# Patient Record
Sex: Male | Born: 1970 | Race: Black or African American | Hispanic: No | Marital: Married | Smoking: Former smoker
Health system: Southern US, Community
[De-identification: ages and names within clinical notes are randomized; demographics above are authoritative.]

## PROBLEM LIST (undated history)

## (undated) DIAGNOSIS — R011 Cardiac murmur, unspecified: Secondary | ICD-10-CM

## (undated) HISTORY — PX: WRIST SURGERY: SHX841

---

## 2004-03-02 ENCOUNTER — Emergency Department: Payer: Self-pay | Admitting: Emergency Medicine

## 2004-11-13 ENCOUNTER — Inpatient Hospital Stay: Payer: Self-pay | Admitting: Internal Medicine

## 2007-07-16 ENCOUNTER — Ambulatory Visit: Payer: Self-pay | Admitting: Internal Medicine

## 2016-06-23 ENCOUNTER — Encounter: Payer: Self-pay | Admitting: Emergency Medicine

## 2016-06-23 ENCOUNTER — Emergency Department: Payer: BLUE CROSS/BLUE SHIELD

## 2016-06-23 ENCOUNTER — Emergency Department
Admission: EM | Admit: 2016-06-23 | Discharge: 2016-06-23 | Disposition: A | Discharge status: Home / Self Care | Payer: BLUE CROSS/BLUE SHIELD | Attending: Student in an Organized Health Care Education/Training Program | Admitting: Student in an Organized Health Care Education/Training Program

## 2016-06-23 DIAGNOSIS — Y929 Unspecified place or not applicable: Secondary | ICD-10-CM | POA: Insufficient documentation

## 2016-06-23 DIAGNOSIS — W1839XA Other fall on same level, initial encounter: Secondary | ICD-10-CM | POA: Insufficient documentation

## 2016-06-23 DIAGNOSIS — F17210 Nicotine dependence, cigarettes, uncomplicated: Secondary | ICD-10-CM | POA: Diagnosis not present

## 2016-06-23 DIAGNOSIS — S0181XA Laceration without foreign body of other part of head, initial encounter: Secondary | ICD-10-CM | POA: Diagnosis not present

## 2016-06-23 DIAGNOSIS — Y999 Unspecified external cause status: Secondary | ICD-10-CM | POA: Diagnosis not present

## 2016-06-23 DIAGNOSIS — S0990XA Unspecified injury of head, initial encounter: Secondary | ICD-10-CM | POA: Diagnosis present

## 2016-06-23 DIAGNOSIS — R55 Syncope and collapse: Secondary | ICD-10-CM | POA: Diagnosis not present

## 2016-06-23 DIAGNOSIS — Y939 Activity, unspecified: Secondary | ICD-10-CM | POA: Insufficient documentation

## 2016-06-23 HISTORY — DX: Cardiac murmur, unspecified: R01.1

## 2016-06-23 LAB — CBC
HEMATOCRIT: 42.5 % (ref 40.0–52.0)
HEMOGLOBIN: 14.3 g/dL (ref 13.0–18.0)
MCH: 35.3 pg — ABNORMAL HIGH (ref 26.0–34.0)
MCHC: 33.7 g/dL (ref 32.0–36.0)
MCV: 104.7 fL — AB (ref 80.0–100.0)
Platelets: 226 10*3/uL (ref 150–440)
RBC: 4.06 MIL/uL — ABNORMAL LOW (ref 4.40–5.90)
RDW: 12.5 % (ref 11.5–14.5)
WBC: 6.6 10*3/uL (ref 3.8–10.6)

## 2016-06-23 LAB — URINALYSIS, COMPLETE (UACMP) WITH MICROSCOPIC
BACTERIA UA: NONE SEEN
Bilirubin Urine: NEGATIVE
GLUCOSE, UA: NEGATIVE mg/dL
KETONES UR: NEGATIVE mg/dL
Leukocytes, UA: NEGATIVE
Nitrite: NEGATIVE
PROTEIN: NEGATIVE mg/dL
Specific Gravity, Urine: 1.014 (ref 1.005–1.030)
pH: 5 (ref 5.0–8.0)

## 2016-06-23 LAB — BASIC METABOLIC PANEL
ANION GAP: 8 (ref 5–15)
BUN: 12 mg/dL (ref 6–20)
CHLORIDE: 103 mmol/L (ref 101–111)
CO2: 28 mmol/L (ref 22–32)
Calcium: 9.5 mg/dL (ref 8.9–10.3)
Creatinine, Ser: 1.14 mg/dL (ref 0.61–1.24)
GFR calc Af Amer: >60 mL/min (ref >60)
GLUCOSE: 91 mg/dL (ref 65–99)
Potassium: 3.6 mmol/L (ref 3.5–5.1)
Sodium: 139 mmol/L (ref 135–145)

## 2016-06-23 LAB — TROPONIN I

## 2016-06-23 MED ORDER — LIDOCAINE HCL (PF) 1 % IJ SOLN
30.0000 mL | Freq: Once | INTRAMUSCULAR | Status: AC
  Administered 2016-06-23: 30 mL

## 2016-06-23 MED ORDER — LIDOCAINE HCL (PF) 1 % IJ SOLN: 10.0000 mL | Freq: Once | INTRAMUSCULAR | Status: DC

## 2016-06-23 MED ORDER — LORAZEPAM 2 MG PO TABS: 2.0000 mg | ORAL_TABLET | Freq: Once | ORAL | Status: DC

## 2016-06-23 MED ORDER — LIDOCAINE HCL (PF) 1 % IJ SOLN
INTRAMUSCULAR | Status: AC
  Administered 2016-06-23: 30 mL
  Filled 2016-06-23: qty 5

## 2016-06-23 MED ORDER — LIDOCAINE-EPINEPHRINE-TETRACAINE (LET) SOLUTION
NASAL | Status: AC
  Administered 2016-06-23: 3 mL via TOPICAL
  Filled 2016-06-23: qty 3

## 2016-06-23 MED ORDER — LIDOCAINE-EPINEPHRINE-TETRACAINE (LET) SOLUTION
3.0000 mL | Freq: Once | NASAL | Status: AC
  Administered 2016-06-23: 3 mL via TOPICAL

## 2016-06-23 MED ORDER — TRAMADOL HCL 50 MG PO TABS: 50.0000 mg | ORAL_TABLET | Freq: Four times a day (QID) | ORAL | 0 refills | Status: DC | PRN

## 2016-06-23 NOTE — ED Triage Notes (Signed)
Pt says he got up to go use the bathroom about 115am, was returning to bed when he felt dizzy and had a syncopal episode; says his wife woke him up on the floor; pt says he could hear his wife calling his name when he first fell but couldn't answer; pt alert and oriented x 3; talking in complete coherent sentences; laceration to chin; pt says he has had syncopal episodes over the last several years but has never seen an MD for same;

## 2016-06-23 NOTE — ED Notes (Signed)
Pt refusing chest xr, Dr. Roxan Hockey aware

## 2016-06-23 NOTE — ED Provider Notes (Signed)
[facility] Emergency Department Provider Note    None    (approximate)  I have reviewed the triage vital signs and the nursing notes.   HISTORY  Chief Complaint Loss of Consciousness    HPI Brandon Charles is a 46 y.o. male with a syncopal event falling and hitting his chin. Patient had several drinks tonight. Was walking in the room when wife heard. The patient was found on the floor with bleeding to his chin. Was out for roughly 1 minute. No shaking or seizure-like activity. Denies any chest pain or shortness of breath. Denies any numbness or tingling. Is not on any blood thinners. Patient did not want to come to the ER. And stated he just wanted to put Vaseline on the cot.   Past Medical History:  Diagnosis Date  . Murmur    History reviewed. No pertinent family history. Past Surgical History:  Procedure Laterality Date  . WRIST SURGERY Right    There are no active problems to display for this patient.     Prior to Admission medications   Medication Sig Start Date End Date Taking? Authorizing Provider  traMADol (ULTRAM) 50 MG tablet Take 1 tablet (50 mg total) by mouth every 6 (six) hours as needed. 06/23/16 06/23/17  Willy Eddy, MD    Allergies Patient has no known allergies.    Social History Social History  Substance Use Topics  . Smoking status: Current Every Day Smoker    Packs/day: 0.50    Types: Cigarettes  . Smokeless tobacco: Never Used  . Alcohol use Yes    Review of Systems Patient denies headaches, rhinorrhea, blurry vision, numbness, shortness of breath, chest pain, edema, cough, abdominal pain, nausea, vomiting, diarrhea, dysuria, fevers, rashes or hallucinations unless otherwise stated above in HPI. ____________________________________________   PHYSICAL EXAM:  VITAL SIGNS: Vitals:   06/23/16 0234 06/23/16 0445  BP: 125/87 125/65  Pulse: 76 73  Resp: 18 16  Temp: 98.7 F (37.1 C)     Constitutional:  Alert and oriented.  in no acute distress. Eyes: Conjunctivae are normal. PERRL. EOMI. Head: Atraumatic. Nose: No congestion/rhinnorhea. Mouth/Throat: Mucous membranes are moist.  Oropharynx non-erythematous. Neck: No stridor. Painless ROM. No cervical spine tenderness to palpation Hematological/Lymphatic/Immunilogical: No cervical lymphadenopathy. Cardiovascular: Normal rate, regular rhythm. Grossly normal heart sounds.  Good peripheral circulation. Respiratory: Normal respiratory effort.  No retractions. Lungs CTAB. Gastrointestinal: Soft and nontender. No distention. No abdominal bruits. No CVA tenderness. Musculoskeletal: No lower extremity tenderness nor edema.  No joint effusions. Neurologic:  Normal speech and language. No gross focal neurologic deficits are appreciated. No gait instability. Skin:  Skin is warm, dry and intact. 3cm laceration to inferior chin.  No active hemorrhage.   Psychiatric: Mood and affect are normal. Speech and behavior are normal.  ____________________________________________   LABS (all labs ordered are listed, but only abnormal results are displayed)  Results for orders placed or performed during the hospital encounter of 06/23/16 (from the past 24 hour(s))  Basic metabolic panel     Status: None   Collection Time: 06/23/16  2:43 AM  Result Value Ref Range   Sodium 139 135 - 145 mmol/L   Potassium 3.6 3.5 - 5.1 mmol/L   Chloride 103 101 - 111 mmol/L   CO2 28 22 - 32 mmol/L   Glucose, Bld 91 65 - 99 mg/dL   BUN 12 6 - 20 mg/dL   Creatinine, Ser 4.09 0.61 - 1.24 mg/dL   Calcium 9.5  8.9 - 10.3 mg/dL   GFR calc non Af Amer >60 >60 mL/min   GFR calc Af Amer >60 >60 mL/min   Anion gap 8 5 - 15  CBC     Status: Abnormal   Collection Time: 06/23/16  2:43 AM  Result Value Ref Range   WBC 6.6 3.8 - 10.6 K/uL   RBC 4.06 (L) 4.40 - 5.90 MIL/uL   Hemoglobin 14.3 13.0 - 18.0 g/dL   HCT 78.2 95.6 - 21.3 %   MCV 104.7 (H) 80.0 - 100.0 fL   MCH 35.3 (H)  26.0 - 34.0 pg   MCHC 33.7 32.0 - 36.0 g/dL   RDW 08.6 57.8 - 46.9 %   Platelets 226 150 - 440 K/uL  Urinalysis, Complete w Microscopic     Status: Abnormal   Collection Time: 06/23/16  2:43 AM  Result Value Ref Range   Color, Urine YELLOW (A) YELLOW   APPearance CLEAR (A) CLEAR   Specific Gravity, Urine 1.014 1.005 - 1.030   pH 5.0 5.0 - 8.0   Glucose, UA NEGATIVE NEGATIVE mg/dL   Hgb urine dipstick MODERATE (A) NEGATIVE   Bilirubin Urine NEGATIVE NEGATIVE   Ketones, ur NEGATIVE NEGATIVE mg/dL   Protein, ur NEGATIVE NEGATIVE mg/dL   Nitrite NEGATIVE NEGATIVE   Leukocytes, UA NEGATIVE NEGATIVE   RBC / HPF 6-30 0 - 5 RBC/hpf   WBC, UA 0-5 0 - 5 WBC/hpf   Bacteria, UA NONE SEEN NONE SEEN   Squamous Epithelial / LPF 0-5 (A) NONE SEEN   Mucous PRESENT   Troponin I     Status: None   Collection Time: 06/23/16  2:43 AM  Result Value Ref Range   Troponin I <0.03 <0.03 ng/mL   ____________________________________________  EKG My review and personal interpretation at Time: 2:39   Indication: syncope  Rate: 85  Rhythm: sinus Axis: normal Other: normal intervals, no WPW or brugada, no st elevations or depressions ____________________________________________  RADIOLOGY  I personally reviewed all radiographic images ordered to evaluate for the above acute complaints and reviewed radiology reports and findings.  These findings were personally discussed with the patient.  Please see medical record for radiology report.  ____________________________________________   PROCEDURES  Procedure(s) performed:  Marland KitchenMarland KitchenLaceration Repair Date/Time: 06/23/2016 6:34 AM Performed by: Willy Eddy Authorized by: Willy Eddy   Consent:    Consent obtained:  Verbal   Consent given by:  Patient Anesthesia (see MAR for exact dosages):    Anesthesia method:  Local infiltration   Local anesthetic:  Lidocaine 1% w/o epi Laceration details:    Location:  Face   Face location:  Chin   Length  (cm):  3   Depth (mm):  4 Repair type:    Repair type:  Simple Pre-procedure details:    Preparation:  Patient was prepped and draped in usual sterile fashion Exploration:    Wound exploration: wound explored through full range of motion     Contaminated: yes   Treatment:    Area cleansed with:  Saline   Amount of cleaning:  Standard   Irrigation solution:  Sterile saline Mucous membrane repair:    Suture size:  6-0   Suture material:  Vicryl   Number of sutures:  1 Skin repair:    Repair method:  Sutures   Suture size:  4-0   Suture material:  Nylon   Suture technique:  Running   Number of sutures:  7 Approximation:    Approximation:  Close  Vermilion border: well-aligned   Post-procedure details:    Dressing:  Open (no dressing)   Patient tolerance of procedure:  Tolerated well, no immediate complications      Critical Care performed: no ____________________________________________   INITIAL IMPRESSION / ASSESSMENT AND PLAN / ED COURSE  Pertinent labs & imaging results that were available during my care of the patient were reviewed by me and considered in my medical decision making (see chart for details).  DDX: lac, contusion, concussion, ich   Brandon Charles is a 46 y.o. who presents to the ED with ffs and syncope as described above with 3 cm laceration on chin. No distal loss of sensation of motor deficit. Denies any other injuries. Td UTD. VSS. Exam with distal NV intact. No functional tendon deficit, no sensory deficit. No signs of erythema or surrounding infection. Blood work is otherwise reassuring. CT imaging of the head ordered to evaluate for acute traumatic injury.  CT imaging unremarkable. No chest pain or shortness of breath prior to syncopal event and patient with a normal EKG and troponin.  He did admit to drinking alcohol which could've put him at risk for increased dehydration.   Plan lac repair.  Laceration was explored, irrigated, and repaired  without complications. No FB in a bloodless field.    Discussed at length wound care and infection precautions with patient.       ____________________________________________   FINAL CLINICAL IMPRESSION(S) / ED DIAGNOSES  Final diagnoses:  Laceration of chin without complication, initial encounter  Syncope and collapse      NEW MEDICATIONS STARTED DURING THIS VISIT:  New Prescriptions   TRAMADOL (ULTRAM) 50 MG TABLET    Take 1 tablet (50 mg total) by mouth every 6 (six) hours as needed.     Note:  This document was prepared using Dragon voice recognition software and may include unintentional dictation errors. `   Willy Eddy, MD 06/23/16 [phone]

## 2016-06-23 NOTE — ED Notes (Signed)
Dr. Roxan Hockey at bedside for lac repair

## 2016-08-26 ENCOUNTER — Encounter (INDEPENDENT_AMBULATORY_CARE_PROVIDER_SITE_OTHER): Payer: Self-pay

## 2016-08-26 ENCOUNTER — Ambulatory Visit (INDEPENDENT_AMBULATORY_CARE_PROVIDER_SITE_OTHER): Payer: BLUE CROSS/BLUE SHIELD | Admitting: Family Medicine

## 2016-08-26 ENCOUNTER — Encounter: Payer: Self-pay | Admitting: Family Medicine

## 2016-08-26 VITALS — BP 100/68 | HR 94 | Temp 98.8°F | Ht 72.0 in | Wt 144.6 lb

## 2016-08-26 DIAGNOSIS — R55 Syncope and collapse: Secondary | ICD-10-CM | POA: Insufficient documentation

## 2016-08-26 DIAGNOSIS — R079 Chest pain, unspecified: Secondary | ICD-10-CM | POA: Diagnosis not present

## 2016-08-26 NOTE — Patient Instructions (Signed)
Nice to see you. We will get you set up with a cardiologist and we will send them a message regarding your symptoms. Please try to stay well hydrated as I think dehydration is contributing to your dizziness. If you develop chest pain, shortness breath, palpitations, passing out again, or any new or changing symptoms please seek medical attention medially.

## 2016-08-26 NOTE — Progress Notes (Signed)
Marikay AlarEric Joss Friedel, MD Phone: [phone]  Barbara CowerGill N Aycock is a 46 y.o. male who presents today for new patient visit.  Patient reports intermittent lightheadedness. Particularly if he stands up too quickly. He passed out on one occasion 2 months ago. He was evaluated in the emergency room for this. He describes his lightheadedness is feeling weak overall. Feels as though it would improve if he ate something. Typically only occurs if he stands up too quickly. When he passed out he had no incontinence episodes. No chest pain or palpitations prior to. No shortness of breath. He notes he had walked down to the bathroom and turned around after using the bathroom and his wife noted him to be on the floor. He felt a little lightheaded after the episode. He came to easily. Workup in the ED was unremarkable for cause though did note he had been drinking alcohol. He does note occasional lower rib and lower chest discomfort around the xiphoid process that occurs infrequently. Typically occurs with taking a deep breath. Does not happen frequently. It is not exertional. There is no shortness of breath with it. There is no radiation or diaphoresis. No cough. 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 He notes no diabetes or hypertension or cholesterol issues. He reports he had lab work done a month ago through work.  Active Ambulatory Problems    Diagnosis Date Noted  . Chest pain 08/26/2016  . Syncope 08/26/2016   Resolved Ambulatory Problems    Diagnosis Date Noted  . No Resolved Ambulatory Problems   Past Medical History:  Diagnosis Date  . Murmur     Family History  Problem Relation Age of Onset  . Breast cancer Mother     Social History   Social History  . Marital status: Married    Spouse name: N/A  . Number of children: N/A  . Years of education: N/A   Occupational History  . Not on file.   Social History Main Topics  . Smoking status: Current Every Day  Smoker    Packs/day: 0.50    Types: Cigarettes  . Smokeless tobacco: Never Used  . Alcohol use Yes  . Drug use: No  . Sexual activity: Not on file   Other Topics Concern  . Not on file   Social History Narrative  . No narrative on file    ROS  General:  Negative for nexplained weight loss, fever Skin: Negative for new or changing mole, sore that won't heal HEENT: Negative for trouble hearing, trouble seeing, ringing in ears, mouth sores, hoarseness, change in voice, dysphagia. CV:  Positive for chest pain, negative dyspnea, edema, palpitations Resp: Negative for cough, dyspnea, hemoptysis GI: Negative for nausea, vomiting, diarrhea, constipation, abdominal pain, melena, hematochezia. GU: Negative for dysuria, incontinence, urinary hesitance, hematuria, vaginal or penile discharge, polyuria, sexual difficulty, lumps in testicle or breasts MSK: Negative for muscle cramps or aches, joint pain or swelling Neuro: Negative for headaches, weakness, numbness, dizziness, passing out/fainting Psych: Negative for depression, anxiety, memory problems  Objective  Physical Exam Vitals:   08/26/16 1421  BP: 100/68  Pulse: 94  Temp: 98.8 F (37.1 C)   Laying blood pressure 128/86 pulse 89 Sitting blood pressure 106/76 pulse 86 Standing blood pressure 100/64 pulse 91  BP Readings from Last 3 Encounters:  08/26/16 100/68  06/23/16 118/68   Wt Readings from Last 3 Encounters:  08/26/16 144 lb 9.6 oz (65.6 kg)  06/23/16 125 lb (  56.7 kg)    Physical Exam  Constitutional: No distress.  HENT:  Head: Normocephalic and atraumatic.  Mouth/Throat: Oropharynx is clear and moist. No oropharyngeal exudate.  Eyes: Conjunctivae are normal. Pupils are equal, round, and reactive to light.  Cardiovascular: Normal rate, regular rhythm and normal heart sounds.   Pulmonary/Chest: Effort normal and breath sounds normal.  Abdominal: Soft. Bowel sounds are normal. He exhibits no distension. There  is no tenderness. There is no rebound and no guarding.  Musculoskeletal: He exhibits no edema.  Neurological: He is alert. Gait normal.  Skin: Skin is warm and dry. He is not diaphoretic.  Psychiatric: Mood and affect normal.   EKG: Normal sinus rhythm, negative precordial T waves, J-point elevation, early repolarization changes  Assessment/Plan:   Chest pain Patient with atypical chest pain described as a cramp. Nonexertional. No shortness of breath or diaphoresis. No risk factor for cardiac disease. Suspect musculoskeletal discomfort given description. EKG performed with no apparent ischemic changes. He'll monitor. Cardiology referral. Given return precautions.  Syncope Patient was single episode of syncope several months ago and was evaluated in the ED. Workup with negative troponins and unremarkable CT scan. No seizure-like activity with it. He is orthostatic today on exam which could be contributing. His EKG at that time revealed LVH and repeat EKG today revealed negative precordial T waves and early repolarization changes. Given syncope and possible LVH we'll have him evaluated by cardiology. Referral will be made. We will send a message to one of our cardiology colleagues to get their input as well. Patient will bring in his recent lab work to determine if he needs any repeat lab work. Patient is given return precautions.   Orders Placed This Encounter  Procedures  . Ambulatory referral to Cardiology    Referral Priority:   Routine    Referral Type:   Consultation    Referral Reason:   Specialty Services Required    Requested Specialty:   Cardiology    Number of Visits Requested:   1  . EKG 12-Lead    No orders of the defined types were placed in this encounter.    Marikay Alar, MD [facility]

## 2016-08-26 NOTE — Assessment & Plan Note (Addendum)
Patient with atypical chest pain described as a cramp. Nonexertional. No shortness of breath or diaphoresis. No risk factor for cardiac disease. Suspect musculoskeletal discomfort given description. EKG performed with no apparent ischemic changes. He'll monitor. Cardiology referral. Given return precautions.

## 2016-08-26 NOTE — Assessment & Plan Note (Addendum)
Patient was single episode of syncope several months ago and was evaluated in the ED. Workup with negative troponins and unremarkable CT scan. No seizure-like activity with it. He is orthostatic today on exam which could be contributing. His EKG at that time revealed LVH and repeat EKG today revealed negative precordial T waves and early repolarization changes. Given syncope and possible LVH we'll have him evaluated by cardiology. Referral will be made. We will send a message to one of our cardiology colleagues to get their input as well. Patient will bring in his recent lab work to determine if he needs any repeat lab work. Patient is given return precautions.

## 2016-08-28 ENCOUNTER — Telehealth: Payer: Self-pay

## 2016-08-28 NOTE — Telephone Encounter (Signed)
New patient referral for syncope please read referral details and note from sonnenberg and advise if it is ok to schedule with EP Dr. Graciela HusbandsKlein or if patient should see Cardio for next available appt in August

## 2016-08-28 NOTE — Telephone Encounter (Signed)
From looking at Dr Purvis SheffieldSonnenberg's note, he references cardiology referral.  Patient appropriate for referral to cardiologist at this time. His note did not reference EP at this time.

## 2016-08-29 ENCOUNTER — Encounter: Payer: Self-pay | Admitting: Cardiovascular Disease

## 2016-08-29 ENCOUNTER — Ambulatory Visit (INDEPENDENT_AMBULATORY_CARE_PROVIDER_SITE_OTHER): Payer: BLUE CROSS/BLUE SHIELD | Admitting: Cardiovascular Disease

## 2016-08-29 VITALS — BP 100/58 | HR 85 | Ht 72.0 in | Wt 136.0 lb

## 2016-08-29 DIAGNOSIS — R079 Chest pain, unspecified: Secondary | ICD-10-CM | POA: Diagnosis not present

## 2016-08-29 DIAGNOSIS — I951 Orthostatic hypotension: Secondary | ICD-10-CM | POA: Diagnosis not present

## 2016-08-29 DIAGNOSIS — R55 Syncope and collapse: Secondary | ICD-10-CM

## 2016-08-29 MED ORDER — FLUDROCORTISONE ACETATE 0.1 MG PO TABS: 0.1000 mg | ORAL_TABLET | Freq: Every day | ORAL | 11 refills | Status: DC

## 2016-08-29 MED ORDER — MIDODRINE HCL 10 MG PO TABS: 10.0000 mg | ORAL_TABLET | Freq: Three times a day (TID) | ORAL | 6 refills | Status: DC | PRN

## 2016-08-29 NOTE — Patient Instructions (Addendum)
Medication Instructions:   Please start florinef once a day  Please take midodrine up to three times a day (1/2 pill or whole pill) as needed  Labwork:  BMP in one month  Testing/Procedures:  No further testing at this time   I recommend watching educational videos on topics of interest to you at:       [email or web address]  Enter code: [email or web address]    Follow-Up: It was a pleasure seeing you in the office today. Please call us if you have new issues that need to be addressed before your next appt.  [phone]  Your physician wants you to follow-up in: 3 months.  You will receive a reminder letter in the mail two months in advance. If you don't receive a letter, please call our office to schedule the follow-up appointment.  If you need a refill on your cardiac medications before your next appointment, please call your pharmacy.

## 2016-08-29 NOTE — Progress Notes (Signed)
Cardiology Office Note  Date:  08/29/2016   ID:  Brandon Charles, DOB [birth year], MRN [id number]  PCP:  Glori LuisSonnenberg, Eric G, MD   Chief Complaint  Patient presents with  . other    New patient. Patient c/o feeling dizzy at times. Patient denies chest pain and SOB.     HPI:  Brandon Charles is a 46 y.o. male  With long history of near-syncope and syncope dating back 20 years previous workup including stress testing and echocardiography unrevealing Chronic orthostasis Recent episode of syncope 2 months ago while getting up to go to the bathroom  Who presents by referral from Dr. De NurseSonneberg for consultation of his orthostasis and syncope  He denies any significant chest pain or shortness of breath, active at baseline Works in [employer] Reports having long history of orthostasis, typically worse when standing for long period time or getting up from a sitting or supine position  He denies any palpitations concerning for arrhythmia Typically recovers relatively quickly though sometimes takes him a day or 2 to recover after near syncope/syncope  He has been to the emergency room in the past for his symptoms Often has to sit on the toilet until dizziness improves and then crawl back to bed  He notes having diabetes or cholesterol issues.   EKG personally reviewed by myself on todays visit Shows normal sinus rhythm with rate 85 bpm no significant ST or T-wave changes   PMH:   has a past medical history of Murmur.  PSH:    Past Surgical History:  Procedure Laterality Date  . WRIST SURGERY Right     Current Outpatient Prescriptions  Currently on no prescription medications  No current facility-administered medications for this visit.      Allergies:   Patient has no known allergies.   Social History:  The patient  reports that he has been smoking Cigarettes.  He has been smoking about 0.50 packs per day. He has never used smokeless tobacco. He reports that he drinks  alcohol. He reports that he does not use drugs.   Family History:   family history includes Breast cancer in his mother.    Review of Systems: Review of Systems  Constitutional: Negative.   Respiratory: Negative.   Cardiovascular: Negative.   Gastrointestinal: Negative.   Musculoskeletal: Negative.   Neurological: Positive for dizziness and loss of consciousness.  Psychiatric/Behavioral: Negative.   All other systems reviewed and are negative.    PHYSICAL EXAM: VS:  BP (!) 100/58 (BP Location: Right Arm, Patient Position: Sitting, Cuff Size: Normal)   Pulse 85   Ht 6' (1.829 m)   Wt 136 lb (61.7 kg)   BMI 18.44 kg/m  , BMI Body mass index is 18.44 kg/m. GEN: Very thin, in no acute distress  HEENT: normal  Neck: no JVD, carotid bruits, or masses Cardiac: RRR; no murmurs, rubs, or gallops,no edema  Respiratory:  clear to auscultation bilaterally, normal work of breathing GI: soft, nontender, nondistended, + BS MS: no deformity or atrophy  Skin: warm and dry, no rash Neuro:  Strength and sensation are intact Psych: euthymic mood, full affect    Recent Labs: 06/23/2016: BUN 12; Creatinine, Ser 1.14; Hemoglobin 14.3; Platelets 226; Potassium 3.6; Sodium 139    Lipid Panel No results found for: CHOL, HDL, LDLCALC, TRIG    Wt Readings from Last 3 Encounters:  08/29/16 136 lb (61.7 kg)  08/26/16 144 lb 9.6 oz (65.6 kg)  06/23/16 125 lb (56.7 kg)  ASSESSMENT AND PLAN:  Syncope, unspecified syncope type - Plan: EKG 12-Lead, Basic Metabolic Panel (BMET) Near-syncope and syncope episodes likely secondary to orthostasis Management as below  Chest pain, unspecified type - Plan: EKG 12-Lead, Basic Metabolic Panel (BMET) He denies any significant chest pain on exertion No plan for ischemia workup at this time  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 BMP in one month Also recommended he have a prescription for midodrine that he take as needed for now He may need 5 mg 3 times a day titrating up to 10 mg 3 times a day if needed for symptom control  Disposition:   F/U  3 months  Patient was seen in consultation for Dr. De Nurse and will be referred back to his clinic for ongoing care of the issues detailed above   Total encounter time more than 60 minutes  Greater than 50% was spent in counseling and coordination of care with the patient    Orders Placed This Encounter  Procedures  . Basic Metabolic Panel (BMET)  . EKG 12-Lead     Signed, Dossie Arbour, M.D., Ph.D. 08/29/2016  [facility] [place], [place] [phone]

## 2016-08-29 NOTE — Telephone Encounter (Signed)
Attempted to schedule from waitlist for a new patient appt today with Dr. Mariah MillingGollan at 11 am.  lmov to call office .

## 2016-11-27 ENCOUNTER — Ambulatory Visit: Payer: BLUE CROSS/BLUE SHIELD | Admitting: Family Medicine

## 2016-11-27 DIAGNOSIS — Z0289 Encounter for other administrative examinations: Secondary | ICD-10-CM

## 2016-12-03 NOTE — Progress Notes (Signed)
Cardiology Office Note  Date:  12/04/2016   ID:  Brandon Charles, DOB Mar 24, 1970, MRN [id number]  PCP:  Glori Luis, MD   Chief Complaint  Patient presents with  . Other    3 month follow up. Patient denies chest pain and SOB. Meds reviewed verbally with patient.     HPI:  Brandon Charles is a 46 y.o. male  With long history of near-syncope and syncope dating back 20 years previous workup including stress testing and echocardiography unrevealing Chronic orthostasis episode of syncope over the summer 2018  while getting up to go to the bathroom  Who presents or follow-up of his orthostasis and syncope Low weight, started on Florinef 0.1 mg daily with midodrine as needed  In follow-up today reports he feels much better,no near syncope or syncope Rare episode of lightheadedness Did not like side effects of midodrine, felt "funny". Thinks he took 5 mg once Tolerating Florinef well, no leg swelling   denies any significant chest pain or shortness of breath, active at baseline Works in [employer]  He declined EKG on today's visit  Other past medical history reviewed long history of orthostasis, typically worse when standing for long period time or getting up from a sitting or supine position  He denies any palpitations concerning for arrhythmia Typically recovers relatively quickly though sometimes takes him a day or 2 to recover after near syncope/syncope  He has been to the emergency room in the past for his symptoms Often has to sit on the toilet until dizziness improves and then crawl back to bed   PMH:   has a past medical history of Murmur.  PSH:    Past Surgical History:  Procedure Laterality Date  . WRIST SURGERY Right    Current Outpatient Prescriptions on File Prior to Visit  Medication Sig Dispense Refill  . fludrocortisone (FLORINEF) 0.1 MG tablet Take 1 tablet (0.1 mg total) by mouth daily. 30 tablet 11  . midodrine (PROAMATINE) 10 MG tablet Take  1 tablet (10 mg total) by mouth 3 (three) times daily as needed. 90 tablet 6   No current facility-administered medications on file prior to visit.     Allergies:   Patient has no known allergies.   Social History:  The patient  reports that he has been smoking Cigarettes.  He has been smoking about 0.50 packs per day. He has never used smokeless tobacco. He reports that he drinks alcohol. He reports that he does not use drugs.   Family History:   family history includes Breast cancer in his mother.    Review of Systems:- Review of Systems  Constitutional: Negative.   Respiratory: Negative.   Cardiovascular: Negative.   Gastrointestinal: Negative.   Musculoskeletal: Negative.   Neurological:       Rare episodes of dizziness if he stands up too quickly  Psychiatric/Behavioral: Negative.   All other systems reviewed and are negative.    PHYSICAL EXAM: VS:  BP 108/62 (BP Location: Left Arm, Patient Position: Sitting, Cuff Size: Normal)   Pulse 94   Ht 6' (1.829 m)   Wt 138 lb 8 oz (62.8 kg)   BMI 18.78 kg/m  , BMI Body mass index is 18.78 kg/m.  GEN: Very thin, in no acute distress  HEENT: normal  Neck: no JVD, carotid bruits, or masses Cardiac: RRR; no murmurs, rubs, or gallops,no edema  Respiratory:  clear to auscultation bilaterally, normal work of breathing GI: soft, nontender, nondistended, + BS MS:  no deformity or atrophy  Skin: warm and dry, no rash Neuro:  Strength and sensation are intact Psych: euthymic mood, full affect    Recent Labs: 06/23/2016: BUN 12; Creatinine, Ser 1.14; Hemoglobin 14.3; Platelets 226; Potassium 3.6; Sodium 139    Lipid Panel No results found for: CHOL, HDL, LDLCALC, TRIG    Wt Readings from Last 3 Encounters:  12/04/16 138 lb 8 oz (62.8 kg)  08/29/16 136 lb (61.7 kg)  08/26/16 144 lb 9.6 oz (65.6 kg)       ASSESSMENT AND PLAN:  Syncope, unspecified syncope type - Plan: EKG 12-Lead, Basic Metabolic Panel  (BMET) Near-syncope and syncope episodes likely secondary to orthostasis Started on Florinef 0.1 mg daily on his last clinic visit with improvement of his symptoms We will check basic metabolic panel today Continue current dosing Midodrine only as needed for severe orthostasis verall stable  Chest pain, unspecified type - Plan: EKG 12-Lead, Basic Metabolic Panel (BMET) He denies any significant chest pain on exertion No further workup at this time, stable  Orthostatic hypotension As above will continue Florinef daily, midodrine only as needed BMP ordered as above  Disposition:   F/U  12 months   Total encounter time more than 25 minutes  Greater than 50% was spent in counseling and coordination of care with the patient      Orders Placed This Encounter  Procedures  . Basic Metabolic Panel (BMET)     Signed, Dossie Arbour, M.D., Ph.D. 12/04/2016  [facility] [place], [place] [phone]

## 2016-12-04 ENCOUNTER — Encounter: Payer: Self-pay | Admitting: Cardiovascular Disease

## 2016-12-04 ENCOUNTER — Ambulatory Visit (INDEPENDENT_AMBULATORY_CARE_PROVIDER_SITE_OTHER): Payer: BLUE CROSS/BLUE SHIELD | Admitting: Cardiovascular Disease

## 2016-12-04 VITALS — BP 108/62 | HR 94 | Ht 72.0 in | Wt 138.5 lb

## 2016-12-04 DIAGNOSIS — I951 Orthostatic hypotension: Secondary | ICD-10-CM

## 2016-12-04 DIAGNOSIS — R55 Syncope and collapse: Secondary | ICD-10-CM

## 2016-12-04 MED ORDER — FLUDROCORTISONE ACETATE 0.1 MG PO TABS: 0.1000 mg | ORAL_TABLET | Freq: Every day | ORAL | 4 refills | Status: DC

## 2016-12-04 NOTE — Patient Instructions (Addendum)
Medication Instructions:   No medication changes made  Labwork:  BMP today for syncope  Testing/Procedures:  No further testing at this time   Follow-Up: It was a pleasure seeing you in the office today. Please call us if you have new issues that need to be addressed before your next appt.  [phone]  Your physician wants you to follow-up in: 12 months.  You will receive a reminder letter in the mail two months in advance. If you don't receive a letter, please call our office to schedule the follow-up appointment.  If you need a refill on your cardiac medications before your next appointment, please call your pharmacy.

## 2016-12-06 LAB — BASIC METABOLIC PANEL
BUN/Creatinine Ratio: 11 (ref 9–20)
BUN: 13 mg/dL (ref 6–24)
CO2: 22 mmol/L (ref 20–29)
CREATININE: 1.2 mg/dL (ref 0.76–1.27)
Calcium: 10.1 mg/dL (ref 8.7–10.2)
Chloride: 98 mmol/L (ref 96–106)
GFR calc Af Amer: 85 mL/min/{1.73_m2} (ref >59)
GFR calc non Af Amer: 74 mL/min/{1.73_m2} (ref >59)
Glucose: 81 mg/dL (ref 65–99)
Potassium: 4.6 mmol/L (ref 3.5–5.2)
SODIUM: 141 mmol/L (ref 134–144)

## 2016-12-09 ENCOUNTER — Telehealth: Payer: Self-pay | Admitting: Cardiovascular Disease

## 2016-12-09 NOTE — Telephone Encounter (Signed)
Pt returning our call for lab results ° °Please call back ° °

## 2016-12-09 NOTE — Telephone Encounter (Signed)
See results note. 

## 2018-05-26 IMAGING — CT CT HEAD W/O CM
3 series · 15 of 45 positions shown, 18 images · non-contrast
Comparison: None.

CLINICAL DATA: Syncopal episode after becoming lightheaded while
arising to use the bathroom at [DATE].

EXAM:
CT HEAD WITHOUT CONTRAST
TECHNIQUE: Contiguous axial images were obtained from the base of the skull
through the vertex without intravenous contrast.

[Series 2: head wo · axial · 0.40mm/px · z∈[-103,+12]mm · 9 of 28 slices shown, 12 images]
[im 3/28  brain]
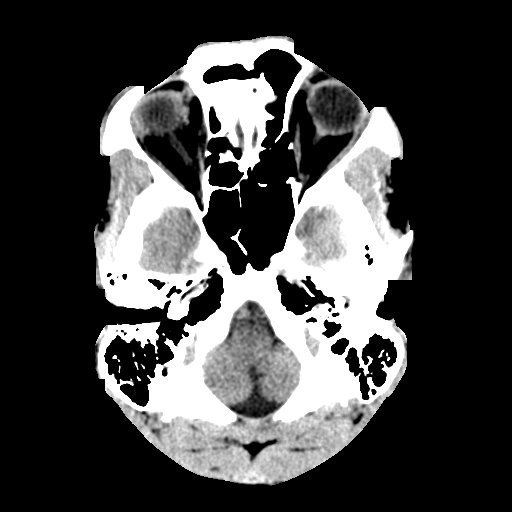
[im 3/28  bone]
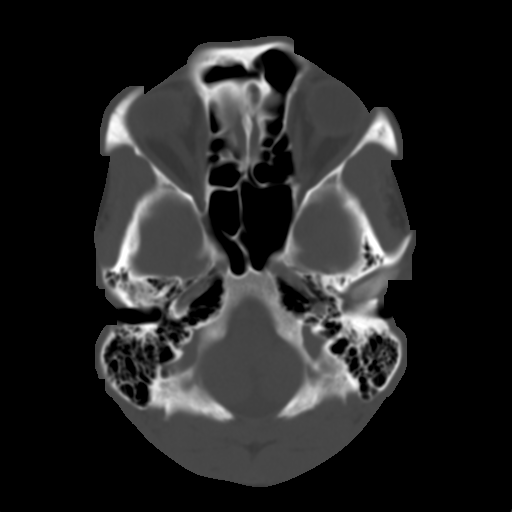
[im 6/28  brain]
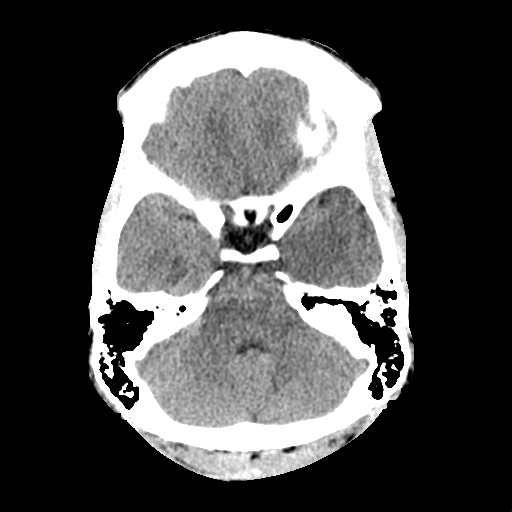
[im 9/28  brain]
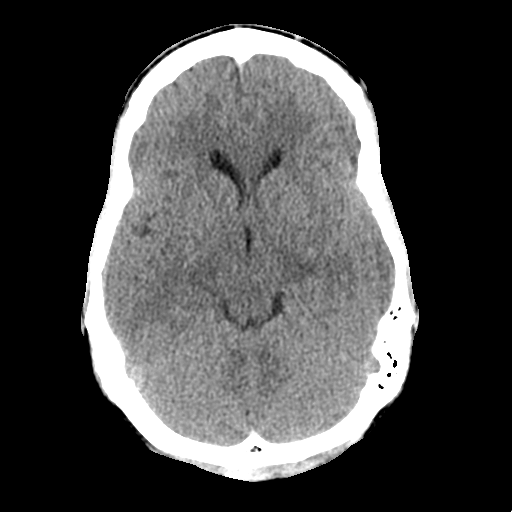
[im 12/28  brain]
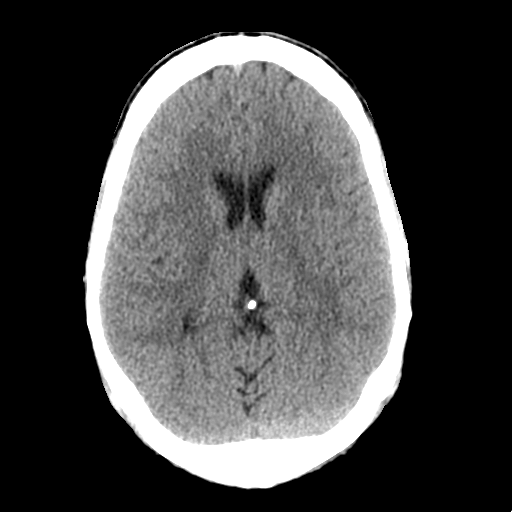
[im 15/28  brain]
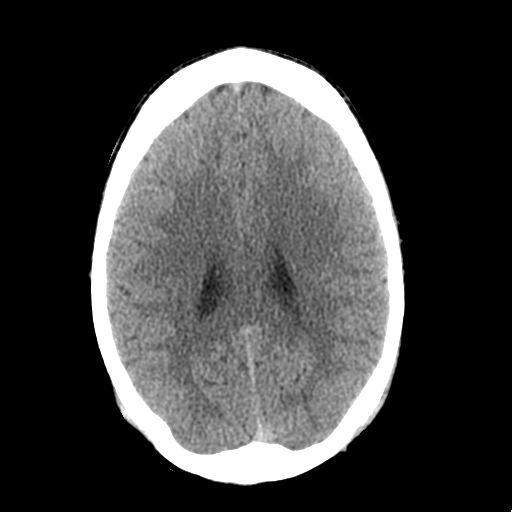
[im 15/28  bone]
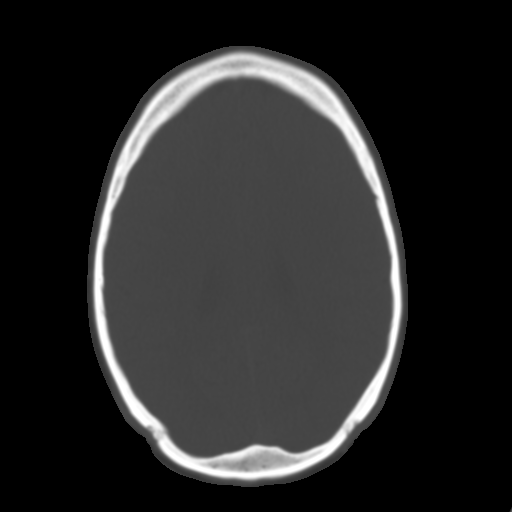
[im 17/28  brain]
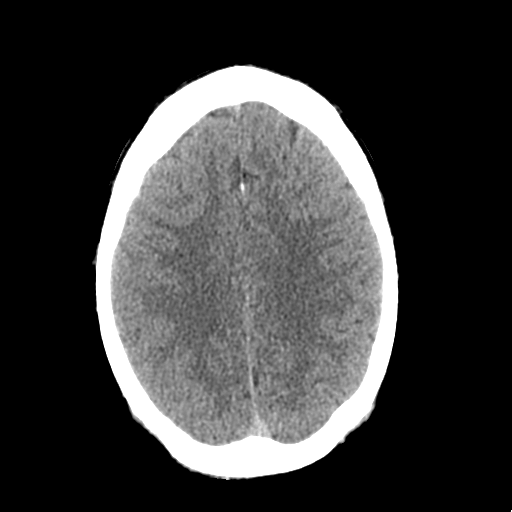
[im 20/28  brain]
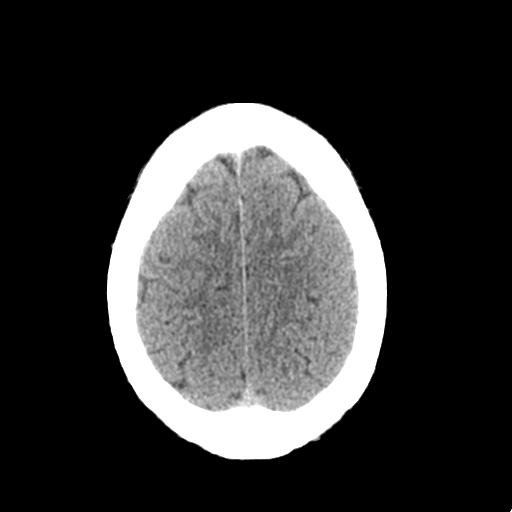
[im 23/28  brain]
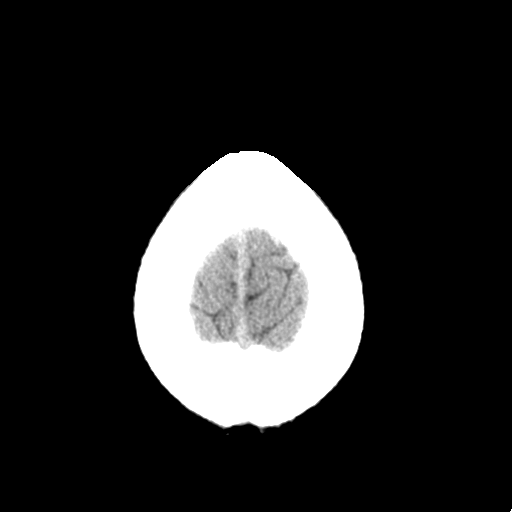
[im 26/28  brain]
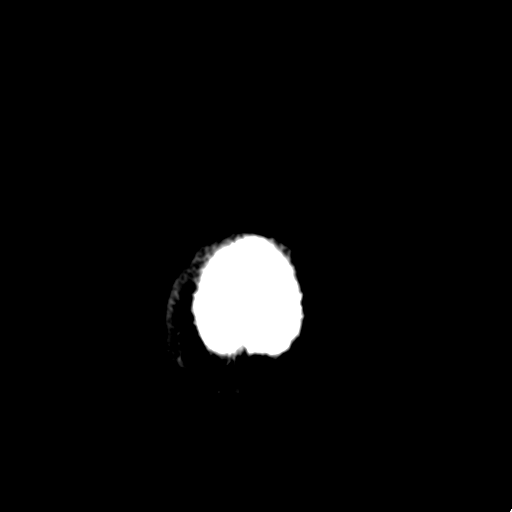
[im 26/28  bone]
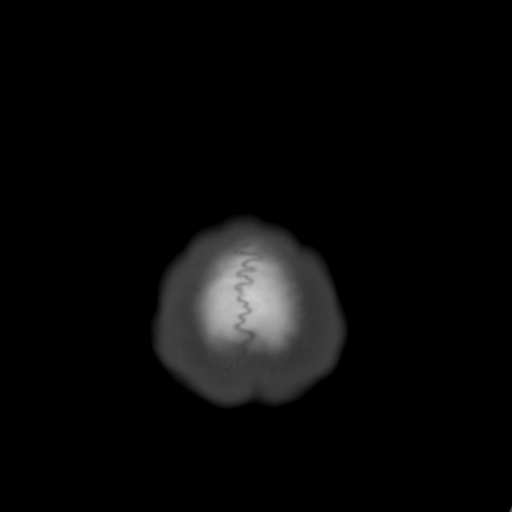

[Series 4: coronal soft tissue · coronal · 0.27mm/px · 3 of 65 slices shown]
[im 22/65  brain]
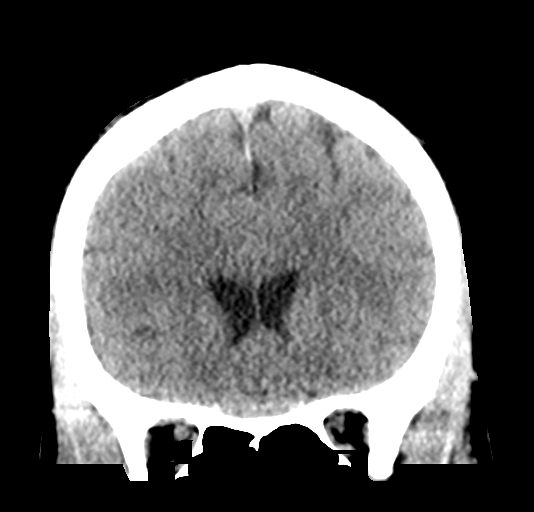
[im 29/65  brain]
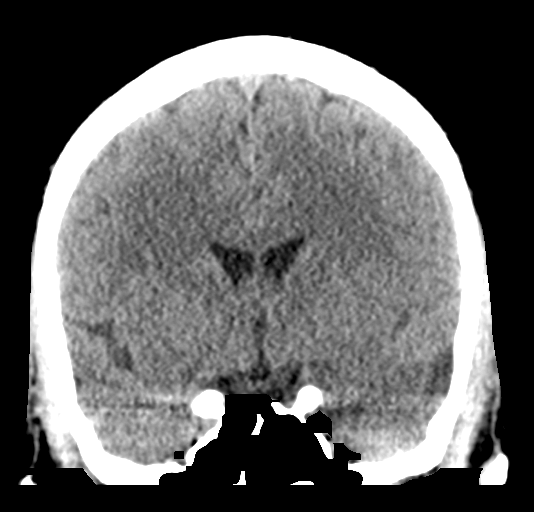
[im 36/65  brain]
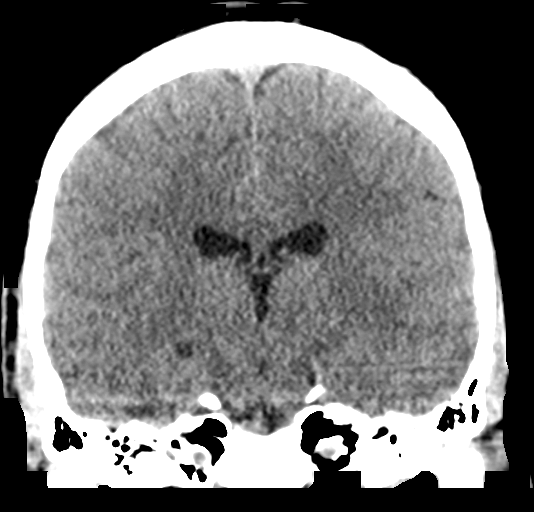

[Series 5: sagittal soft tissue · sagittal · 0.27mm/px · 3 of 49 slices shown]
[im 17/49  brain]
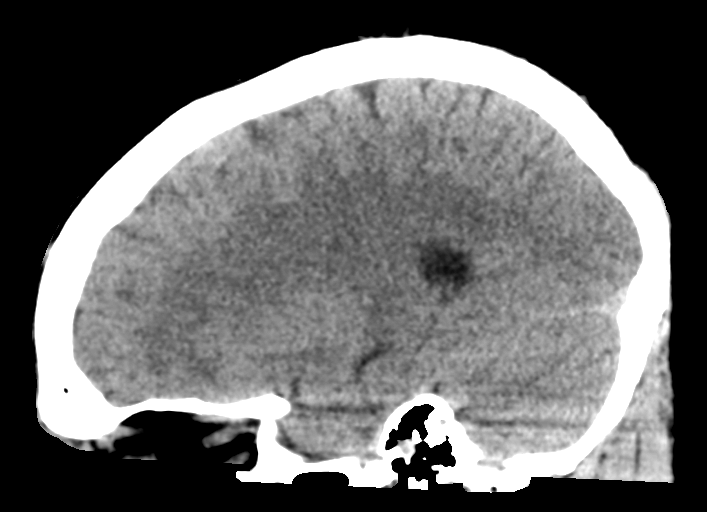
[im 25/49  brain]
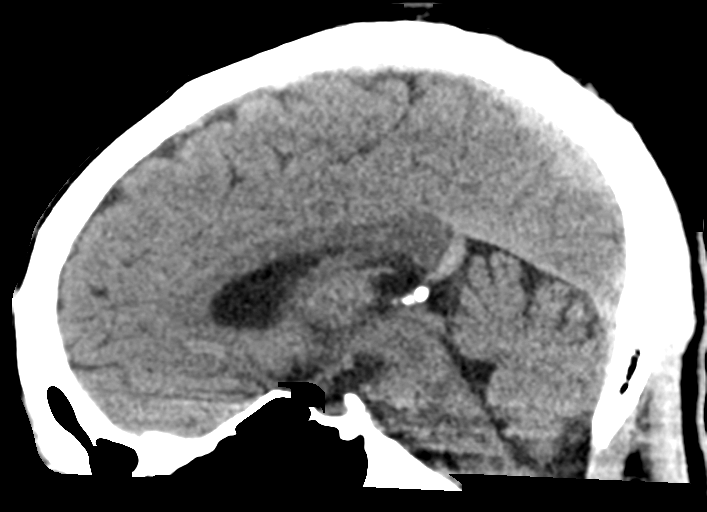
[im 33/49  brain]
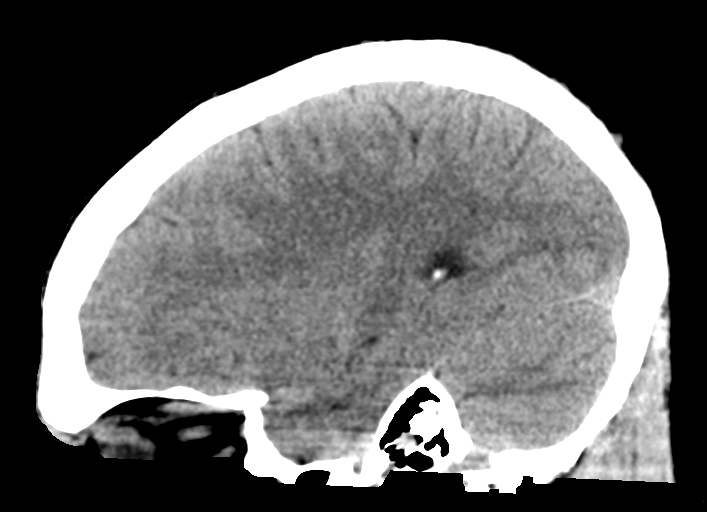

[15 of 45 positions shown; findings below may reference images not displayed]

FINDINGS: Brain: There is no intracranial hemorrhage, mass or evidence of
acute infarction. There is no extra-axial fluid collection. Gray
matter and white matter appear normal. Cerebral volume is normal for
age. Brainstem and posterior fossa are unremarkable. The CSF spaces
appear normal.

Vascular: No hyperdense vessel or unexpected calcification.

Skull: Normal. Negative for fracture or focal lesion.

Sinuses/Orbits: No acute finding.

Other: None.
IMPRESSION: Normal brain

## 2018-06-23 ENCOUNTER — Telehealth: Payer: Self-pay

## 2018-06-23 NOTE — Telephone Encounter (Signed)
Virtual Visit Pre-Appointment Phone Call  Steps For Call:  1. Confirm consent - "In the setting of the current Covid19 crisis, you are scheduled for a VIDEO visit with your provider on 06/29/2018 at 8:00AM.  Just as we do with many in-office visits, in order for you to participate in this visit, we must obtain consent.  If you'd like, I can send this to your mychart (if signed up) or email for you to review.  Otherwise, I can obtain your verbal consent now.  All virtual visits are billed to your insurance company just like a normal visit would be.  By agreeing to a virtual visit, we'd like you to understand that the technology does not allow for your provider to perform an examination, and thus may limit your provider's ability to fully assess your condition.  Finally, though the technology is pretty good, we cannot assure that it will always work on either your or our end, and in the setting of a video visit, we may have to convert it to a phone-only visit.  In either situation, we cannot ensure that we have a secure connection.  Are you willing to proceed?"  2. Give patient instructions for WebEx download to smartphone as below if video visit  3. Advise patient to be prepared with any vital sign or heart rhythm information, their current medicines, and a piece of paper and pen handy for any instructions they may receive the day of their visit  4. Inform patient they will receive a phone call 15 minutes prior to their appointment time (may be from unknown caller ID) so they should be prepared to answer  5. Confirm that appointment type is correct in Epic appointment notes (video vs telephone)    TELEPHONE CALL NOTE  Brandon Charles has been deemed a candidate for a follow-up tele-health visit to limit community exposure during the Covid-19 pandemic. I spoke with the patient via phone to ensure availability of phone/video source, confirm preferred email & phone number, and discuss  instructions and expectations.  I reminded Brandon Charles to be prepared with any vital sign and/or heart rhythm information that could potentially be obtained via home monitoring, at the time of his visit. I reminded Brandon Charles to expect a phone call at the time of his visit if his visit.  Did the patient verbally acknowledge consent to treatment? YES  Brandon Charles, [place] 06/23/2018 3:14 PM   DOWNLOADING THE WEBEX SOFTWARE TO SMARTPHONE  - If Apple, go to [employer] and type in WebEx in the search bar. Download [employer], the blue/green circle. The app is free but as with any other app downloads, their phone may require them to verify saved payment information or Apple password. The patient does NOT have to create an account.  - If Android, ask patient to go to [employer] and type in WebEx in the search bar. Download [employer], the blue/green circle. The app is free but as with any other app downloads, their phone may require them to verify saved payment information or Android password. The patient does NOT have to create an account.   CONSENT FOR TELE-HEALTH VISIT - PLEASE REVIEW  I hereby voluntarily request, consent and authorize [facility] and its employed or contracted physicians, physician assistants, nurse practitioners or other licensed health care professionals (the Practitioner), to provide me with telemedicine health care services (the "Services") as deemed necessary by the treating Practitioner. I acknowledge  and consent to receive the Services by the Practitioner via telemedicine. I understand that the telemedicine visit will involve communicating with the Practitioner through live audiovisual communication technology and the disclosure of certain medical information by electronic transmission. I acknowledge that I have been given the opportunity to request an in-person assessment or other available alternative prior to the telemedicine visit  and am voluntarily participating in the telemedicine visit.  I understand that I have the right to withhold or withdraw my consent to the use of telemedicine in the course of my care at any time, without affecting my right to future care or treatment, and that the Practitioner or I may terminate the telemedicine visit at any time. I understand that I have the right to inspect all information obtained and/or recorded in the course of the telemedicine visit and may receive copies of available information for a reasonable fee.  I understand that some of the potential risks of receiving the Services via telemedicine include:  Marland Kitchen Delay or interruption in medical evaluation due to technological equipment failure or disruption; . Information transmitted may not be sufficient (e.g. poor resolution of images) to allow for appropriate medical decision making by the Practitioner; and/or  . In rare instances, security protocols could fail, causing a breach of personal health information.  Furthermore, I acknowledge that it is my responsibility to provide information about my medical history, conditions and care that is complete and accurate to the best of my ability. I acknowledge that Practitioner's advice, recommendations, and/or decision may be based on factors not within their control, such as incomplete or inaccurate data provided by me or distortions of diagnostic images or specimens that may result from electronic transmissions. I understand that the practice of medicine is not an exact science and that Practitioner makes no warranties or guarantees regarding treatment outcomes. I acknowledge that I will receive a copy of this consent concurrently upon execution via email to the email address I last provided but may also request a printed copy by calling the office of [facility].    I understand that my insurance will be billed for this visit.   I have read or had this consent read to me. . I understand  the contents of this consent, which adequately explains the benefits and risks of the Services being provided via telemedicine.  . I have been provided ample opportunity to ask questions regarding this consent and the Services and have had my questions answered to my satisfaction. . I give my informed consent for the services to be provided through the use of telemedicine in my medical care  By participating in this telemedicine visit I agree to the above.

## 2018-06-28 NOTE — Progress Notes (Addendum)
Virtual Visit via Video Note   This visit type was conducted due to national recommendations for restrictions regarding the COVID-19 Pandemic (e.g. social distancing) in an effort to limit this patient's exposure and mitigate transmission in our community.  Due to her co-morbid illnesses, this patient is at least at moderate risk for complications without adequate follow up.  This format is felt to be most appropriate for this patient at this time.  All issues noted in this document were discussed and addressed.  A limited physical exam was performed with this format.  Please refer to the patient's chart for her consent to telehealth for [facility]    Date:  06/28/2018   ID:  Brandon Charles, DOB 1970-07-22, MRN [id number]  Patient Location:  [address] [place] [address]   Provider location:   [facility], [place] office  PCP:  Glori Luis, MD  Cardiologist:  [facility]  Chief Complaint:  Dizziness orthostasis    History of Present Illness:    Brandon Charles is a 48 y.o. male who presents via audio/video conferencing for a telehealth visit today.   The patient does not symptoms concerning for COVID-19 infection (fever, chills, cough, or new SHORTNESS OF BREATH).   Patient has a past medical history of history of near-syncope and syncope dating back 20 years previous workup including stress testing and echocardiography unrevealing Chronic orthostasis episode of syncope over the summer 2018  while getting up to go to the bathroom  Who presents or follow-up of his orthostasis and syncope  Still having epsiodes of dizziness, Comes and goes, Continues to take fludrocortisone 0.1 daily, "just about every day" Chronic  Low weight, started on Florinef 0.1 mg daily with midodrine as needed Does not take midodrine unless he really needs to, does not like the way it makes him feel, " funny"  He is working at [employer], filling orders  Reports having some back pain from lifting heavy items on a frequent basis Has not seen primary care, does not know who he is supposed to see  No recent episodes of syncope May have had something several months ago, does not remember the details, may have been overtired   denies any significant chest pain or shortness of breath, active at baseline  No recent trips to the emergency room  Prior to taking Florinef had severe symptoms of orthostasis Unable to get his weight up any higher, continues to run low   Prior CV studies:   The following studies were reviewed today:    Past Medical History:  Diagnosis Date  . Murmur    Past Surgical History:  Procedure Laterality Date  . WRIST SURGERY Right      No outpatient medications have been marked as taking for the 06/29/18 encounter (Appointment) with Antonieta Iba, MD.     Allergies:   Patient has no known allergies.   Social History   Tobacco Use  . Smoking status: Current Every Day Smoker    Packs/day: 0.50    Types: Cigarettes  . Smokeless tobacco: Never Used  . Tobacco comment: 1/2 pack every 2 days  Substance Use Topics  . Alcohol use: Yes  . Drug use: No     Current Outpatient Medications on File Prior to Visit  Medication Sig Dispense Refill  . fludrocortisone (FLORINEF) 0.1 MG tablet Take 1 tablet (0.1 mg total) by mouth daily. 90 tablet 4  . midodrine (PROAMATINE) 10 MG tablet Take  1 tablet (10 mg total) by mouth 3 (three) times daily as needed. 90 tablet 6   No current facility-administered medications on file prior to visit.      Family Hx: The patient's family history includes Breast cancer in his mother.  ROS:   Please see the history of present illness.    Review of Systems  Constitutional: Negative.   Respiratory: Negative.   Cardiovascular: Negative.   Gastrointestinal: Negative.   Musculoskeletal: Negative.   Neurological: Positive for dizziness.  Psychiatric/Behavioral: Negative.    All other systems reviewed and are negative.     Labs/Other Tests and Data Reviewed:    Recent Labs: No results found for requested labs within last 8760 hours.   Recent Lipid Panel No results found for: CHOL, TRIG, HDL, CHOLHDL, LDLCALC, LDLDIRECT  Wt Readings from Last 3 Encounters:  12/04/16 138 lb 8 oz (62.8 kg)  08/29/16 136 lb (61.7 kg)  08/26/16 144 lb 9.6 oz (65.6 kg)     Exam:    Vital Signs: Vital signs may also be detailed in the HPI There were no vitals taken for this visit.  Wt Readings from Last 3 Encounters:  12/04/16 138 lb 8 oz (62.8 kg)  08/29/16 136 lb (61.7 kg)  08/26/16 144 lb 9.6 oz (65.6 kg)   Temp Readings from Last 3 Encounters:  08/26/16 98.8 F (37.1 C) (Oral)  06/23/16 98.7 F (37.1 C) (Oral)   BP Readings from Last 3 Encounters:  12/04/16 108/62  08/29/16 (!) 100/58  08/26/16 100/68   Pulse Readings from Last 3 Encounters:  12/04/16 94  08/29/16 85  08/26/16 94    Blood pressure 100 over 60s, rate in the 80s, respirations 16  Well nourished, well developed male in no acute distress. Constitutional:  oriented to person, place, and time. No distress.  Head: Normocephalic and atraumatic.  Eyes:  no discharge. No scleral icterus.  Neck: Normal range of motion. Neck supple.  Pulmonary/Chest: No audible wheezing, no distress, appears comfortable Musculoskeletal: Normal range of motion.  no  tenderness or deformity.  Neurological:   Coordination normal. Full exam not performed Skin:  No rash Psychiatric:  normal mood and affect. behavior is normal. Thought content normal.    ASSESSMENT & PLAN:    Syncope, unspecified syncope type - Plan: EKG 12-Lead, Basic Metabolic Panel (BMET) No recent episodes of near-syncope and syncope  Periodic dizziness consistent with orthostasis Recommended he continue on Florinef 0.1 mg daily  continue on midodrine as needed Suggested he monitor orthostatics at home and call our office with numbers If  still a significant drop may do to increase fludrocortisone up to 0.2 mg daily  Chest pain, unspecified type - Plan: EKG 12-Lead, Basic Metabolic Panel (BMET) Denies chest pain, no further work-up  Orthostatic hypotension As above will continue Florinef He will try to obtain orthostatic numbers for us If still significant drop may need to increase the dosing  Chronic back pain Suggested he establish care with primary care Recommend he try icing, NSAIDs  COVID-19 Education: The signs and symptoms of COVID-19 were discussed with the patient and how to seek care for testing (follow up with PCP or arrange E-visit).  The importance of social distancing was discussed today.  Patient Risk:   After full review of this patients clinical status, I feel that they are at least moderate risk at this time.  Time:   Today, I have spent 25 minutes with the patient with telehealth technology discussing the cardiac  and medical problems/diagnoses detailed above     Medication Adjustments/Labs and Tests Ordered: Current medicines are reviewed at length with the patient today.  Concerns regarding medicines are outlined above.   Tests Ordered: No tests ordered   Medication Changes: No changes made   Disposition: Follow-up in 6 months   Signed, Julien Nordmann, MD  06/28/2018 11:47 AM    [facility] [address], [place], [place] [address]

## 2018-06-29 ENCOUNTER — Telehealth: Payer: Self-pay | Admitting: Cardiovascular Disease

## 2018-06-29 ENCOUNTER — Telehealth (INDEPENDENT_AMBULATORY_CARE_PROVIDER_SITE_OTHER): Payer: Self-pay | Admitting: Cardiovascular Disease

## 2018-06-29 ENCOUNTER — Other Ambulatory Visit: Payer: Self-pay

## 2018-06-29 DIAGNOSIS — I951 Orthostatic hypotension: Secondary | ICD-10-CM

## 2018-06-29 DIAGNOSIS — R079 Chest pain, unspecified: Secondary | ICD-10-CM

## 2018-06-29 DIAGNOSIS — M549 Dorsalgia, unspecified: Secondary | ICD-10-CM

## 2018-06-29 DIAGNOSIS — R55 Syncope and collapse: Secondary | ICD-10-CM

## 2018-06-29 DIAGNOSIS — G8929 Other chronic pain: Secondary | ICD-10-CM

## 2018-06-29 MED ORDER — FLUDROCORTISONE ACETATE 0.1 MG PO TABS: 0.1000 mg | ORAL_TABLET | Freq: Every day | ORAL | 4 refills | Status: DC

## 2018-06-29 NOTE — Patient Instructions (Addendum)
Set up my chart PCPs    Birdie Sons Yehuda Mao, MD  PCP - General, Family Medicine   Since 08/26/2016   [phone]       Other Patient Care Team Members    Antonieta Iba, MD  Consulting Physician, Cardiology   Since 08/30/2016   [phone]         Medication Instructions:  No changes   If you need a refill on your cardiac medications before your next appointment, please call your pharmacy.    Lab work: No new labs needed   If you have labs (blood work) drawn today and your tests are completely normal, you will receive your results only by: Marland Kitchen MyChart Message (if you have MyChart) OR . A paper copy in the mail If you have any lab test that is abnormal or we need to change your treatment, we will call you to review the results.   Testing/Procedures: No new testing needed   Follow-Up: At [facility], you and your health needs are our priority.  As part of our continuing mission to provide you with exceptional heart care, we have created designated Provider Care Teams.  These Care Teams include your primary Cardiologist (physician) and Advanced Practice Providers (APPs -  Physician Assistants and Nurse Practitioners) who all work together to provide you with the care you need, when you need it.  . You will need a follow up appointment in 12 months .   Please call our office 2 months in advance to schedule this appointment.    . Providers on your designated Care Team:   . Nicolasa Ducking, NP . Eula Listen, PA-C . Marisue Ivan, PA-C  Any Other Special Instructions Will Be Listed Below (If Applicable).  For educational health videos Log in to : [email or web address] Or : [email or web address], password : [email or web address]

## 2018-06-29 NOTE — Telephone Encounter (Signed)
Spoke with patient and advised that provider was running a little behind and would be in touch with him soon. He verbalized understanding with no further questions at this time.

## 2018-06-30 ENCOUNTER — Telehealth: Payer: Self-pay | Admitting: Cardiovascular Disease

## 2018-06-30 NOTE — Telephone Encounter (Signed)
Called patient to notify him that it will be at least 24 hours before Dr Mariah Milling will review. He was appreciative.

## 2018-06-30 NOTE — Telephone Encounter (Signed)
Patient is calling back with BP BP today standing 128/80 HR 84 Sitting 125/68 HR 66 Laying down 121/66 HR 74

## 2018-06-30 NOTE — Telephone Encounter (Signed)
Pt was to call in with orthostatic BP readings per Dr.Gollan's 06/29/18 telemedicine visit. Message fwd to Dr. Mariah Milling to review and advise.

## 2018-06-30 NOTE — Telephone Encounter (Signed)
Patient calling to check on status.

## 2018-07-01 NOTE — Telephone Encounter (Signed)
Excellent numbers, Continue florinef at current dose of 0.1 daily

## 2018-07-02 NOTE — Telephone Encounter (Signed)
I spoke with the patient. He is aware that Dr. Mariah Milling was pleased with his BP/ HR numbers and that he recommends that he continue florinef at 0.1 mg once daily.  The patient voices understanding and is agreeable.

## 2019-05-03 NOTE — Progress Notes (Signed)
Date:  05/04/2019   ID:  Brandon Charles, DOB [birth year], MRN [id number]  Patient Location:  [address] [place] [address]   Provider location:   [facility], [place] office  PCP:  Glori Luis, MD  Cardiologist:  Hubbard [facility]  Chief Complaint  Patient presents with  . office visit    6 month F/U; Meds verbally reviewed with patient.    History of Present Illness:    Brandon Charles is a 49 y.o. male  past medical history of history of near-syncope and syncope dating back 20 years previous workup including stress testing and echocardiography unrevealing Chronic orthostasis episode of syncope over the summer 2018  while getting up to go to the bathroom  Who presents or follow-up of his orthostasis and syncope  Denies significant dizziness or near syncope Reports taking his Florinef 0.1 mg daily Rarely takes midodrine Sometimes if he does not feel right he takes extra Florinef No recent lab work available, orders placed for labs today  No near syncope or syncope  EKG personally reviewed by myself on todays visit Shows normal sinus rhythm rate 75 bpm unable to exclude moderate LVH versus normal variant Seen on prior EKGs  Discussed EKG findings, Prior echocardiogram pulled up from 2005, no LVH at that time  Continues to work at [employer], also second job during the daytime Unclear if he has seen primary care recently  No recent trips to the emergency room Weight is stable, continues to run low  Prior CV studies:   The following studies were reviewed today:    Past Medical History:  Diagnosis Date  . Murmur    Past Surgical History:  Procedure Laterality Date  . WRIST SURGERY Right      Current Meds  Medication Sig  . fludrocortisone (FLORINEF) 0.1 MG tablet Take 1 tablet (0.1 mg total) by mouth daily.  . midodrine (PROAMATINE) 10 MG tablet Take 1 tablet (10 mg total) by mouth 3 (three) times daily as needed.     Allergies:   Patient has no known allergies.   Social History   Tobacco Use  . Smoking status: Current Every Day Smoker    Packs/day: 0.50    Types: Cigarettes  . Smokeless tobacco: Never Used  . Tobacco comment: 1/2 pack every 2 days  Substance Use Topics  . Alcohol use: Yes  . Drug use: No     Current Outpatient Medications on File Prior to Visit  Medication Sig Dispense Refill  . fludrocortisone (FLORINEF) 0.1 MG tablet Take 1 tablet (0.1 mg total) by mouth daily. 90 tablet 4  . midodrine (PROAMATINE) 10 MG tablet Take 1 tablet (10 mg total) by mouth 3 (three) times daily as needed. 90 tablet 6   No current facility-administered medications on file prior to visit.     Family Hx: The patient's family history includes Breast cancer in his mother.  ROS:   Please see the history of present illness.    Review of Systems  Constitutional: Negative.   Respiratory: Negative.   Cardiovascular: Negative.   Gastrointestinal: Negative.   Musculoskeletal: Negative.   Neurological: Positive for dizziness.  Psychiatric/Behavioral: Negative.   All other systems reviewed and are negative.    Labs/Other Tests and Data Reviewed:    Recent Labs: No results found for requested labs within last 8760 hours.   Recent Lipid Panel No results found for: CHOL, TRIG, HDL, CHOLHDL, LDLCALC, LDLDIRECT  Wt Readings  from Last 3 Encounters:  05/04/19 145 lb 8 oz (66 kg)  12/04/16 138 lb 8 oz (62.8 kg)  08/29/16 136 lb (61.7 kg)     Exam:    Vital Signs: Vital signs may also be detailed in the HPI BP 120/78 (BP Location: Left Arm, Patient Position: Sitting, Cuff Size: Normal)   Pulse 75   Ht 6' (1.829 m)   Wt 145 lb 8 oz (66 kg)   SpO2 98%   BMI 19.73 kg/m   Constitutional:  oriented to person, place, and time. No distress.  HENT:  Head: Grossly normal Eyes:  no discharge. No scleral icterus.  Neck: No JVD, no carotid bruits  Cardiovascular: Regular rate and rhythm, no murmurs  appreciated Pulmonary/Chest: Clear to auscultation bilaterally, no wheezes or rails Abdominal: Soft.  no distension.  no tenderness.  Musculoskeletal: Normal range of motion Neurological:  normal muscle tone. Coordination normal. 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, [place],  [address]

## 2019-05-04 ENCOUNTER — Other Ambulatory Visit: Payer: Self-pay

## 2019-05-04 ENCOUNTER — Encounter: Payer: Self-pay | Admitting: *Deleted

## 2019-05-04 ENCOUNTER — Ambulatory Visit (INDEPENDENT_AMBULATORY_CARE_PROVIDER_SITE_OTHER): Payer: BC Managed Care – PPO | Admitting: Cardiovascular Disease

## 2019-05-04 ENCOUNTER — Telehealth: Payer: Self-pay | Admitting: Cardiovascular Disease

## 2019-05-04 ENCOUNTER — Encounter: Payer: Self-pay | Admitting: Cardiovascular Disease

## 2019-05-04 VITALS — BP 120/78 | HR 75 | Ht 72.0 in | Wt 145.5 lb

## 2019-05-04 DIAGNOSIS — R079 Chest pain, unspecified: Secondary | ICD-10-CM | POA: Diagnosis not present

## 2019-05-04 DIAGNOSIS — R55 Syncope and collapse: Secondary | ICD-10-CM

## 2019-05-04 DIAGNOSIS — I951 Orthostatic hypotension: Secondary | ICD-10-CM

## 2019-05-04 MED ORDER — MIDODRINE HCL 10 MG PO TABS: 10.0000 mg | ORAL_TABLET | Freq: Three times a day (TID) | ORAL | 6 refills | Status: DC | PRN

## 2019-05-04 MED ORDER — FLUDROCORTISONE ACETATE 0.1 MG PO TABS: 0.1000 mg | ORAL_TABLET | Freq: Every day | ORAL | 4 refills | Status: DC

## 2019-05-04 NOTE — Telephone Encounter (Signed)
Spoke with Dr. Mariah Milling and he mentioned echo but patient did not want it done at this time. Spoke with patient and he wanted to know more about the test that Dr. Mariah Milling had mentioned and he was not prepared to have done today. Reviewed that Dr. Mariah Milling mentioned an echocardiogram which was a ultrasound of his heart. He was agreeable to have this done and let him know that I would put in order and have scheduling call to assist with appointment. He verbalized understanding of our conversation, agreement with plan, and had no further questions at this time.

## 2019-05-04 NOTE — Telephone Encounter (Signed)
Patient calling in regarding a discussion about testing in his appointment. Patient states Dr. Mariah Milling wanted patient to do a "test" but wasn't sure which one. There was no documentation in the AVS or active orders  Please advise

## 2019-05-04 NOTE — Patient Instructions (Addendum)
BMP today  Work excuse note for today  Medication Instructions:  No changes  If you need a refill on your cardiac medications before your next appointment, please call your pharmacy.    Lab work: No new labs needed   If you have labs (blood work) drawn today and your tests are completely normal, you will receive your results only by: Marland Kitchen MyChart Message (if you have MyChart) OR . A paper copy in the mail If you have any lab test that is abnormal or we need to change your treatment, we will call you to review the results.   Testing/Procedures: No new testing needed   Follow-Up: At [facility], you and your health needs are our priority.  As part of our continuing mission to provide you with exceptional heart care, we have created designated Provider Care Teams.  These Care Teams include your primary Cardiologist (physician) and Advanced Practice Providers (APPs -  Physician Assistants and Nurse Practitioners) who all work together to provide you with the care you need, when you need it.  . You will need a follow up appointment in 12 months   . Providers on your designated Care Team:   . Nicolasa Ducking, NP . Eula Listen, PA-C . Marisue Ivan, PA-C  Any Other Special Instructions Will Be Listed Below (If Applicable).  For educational health videos Log in to : [email or web address] Or : [email or web address], password : [email or web address]

## 2019-05-05 LAB — BASIC METABOLIC PANEL
BUN/Creatinine Ratio: 12 (ref 9–20)
BUN: 14 mg/dL (ref 6–24)
CO2: 23 mmol/L (ref 20–29)
Calcium: 9.2 mg/dL (ref 8.7–10.2)
Chloride: 102 mmol/L (ref 96–106)
Creatinine, Ser: 1.16 mg/dL (ref 0.76–1.27)
GFR calc Af Amer: 87 mL/min/{1.73_m2} (ref >59)
GFR calc non Af Amer: 75 mL/min/{1.73_m2} (ref >59)
Glucose: 88 mg/dL (ref 65–99)
Potassium: 4.1 mmol/L (ref 3.5–5.2)
Sodium: 140 mmol/L (ref 134–144)

## 2019-06-08 NOTE — Telephone Encounter (Signed)
Attempted to schedule.  LMOV to call office.  ° °

## 2019-06-21 NOTE — Telephone Encounter (Signed)
Attempted to schedule.  LMOV to call office.  ° °

## 2019-07-16 NOTE — Telephone Encounter (Signed)
lmom to schedule

## 2019-07-19 NOTE — Telephone Encounter (Signed)
Unable to contact .  Closing encounter. Mailed letter.

## 2019-11-30 ENCOUNTER — Other Ambulatory Visit: Payer: BC Managed Care – PPO

## 2019-12-17 ENCOUNTER — Other Ambulatory Visit: Payer: Self-pay

## 2020-01-04 ENCOUNTER — Other Ambulatory Visit: Payer: Self-pay

## 2020-01-14 ENCOUNTER — Other Ambulatory Visit: Payer: Self-pay

## 2020-01-14 ENCOUNTER — Ambulatory Visit (INDEPENDENT_AMBULATORY_CARE_PROVIDER_SITE_OTHER): Payer: Self-pay

## 2020-01-14 DIAGNOSIS — R55 Syncope and collapse: Secondary | ICD-10-CM

## 2020-01-17 LAB — ECHOCARDIOGRAM COMPLETE
AR max vel: 2.4 cm2
AV Area VTI: 2.2 cm2
AV Area mean vel: 2.27 cm2
AV Mean grad: 4 mmHg
AV Peak grad: 7 mmHg
Ao pk vel: 1.32 m/s
Area-P 1/2: 4.26 cm2
Calc EF: 60.2 %
S' Lateral: 3.4 cm
Single Plane A2C EF: 63.3 %
Single Plane A4C EF: 57.2 %

## 2021-06-20 ENCOUNTER — Encounter: Payer: Self-pay | Admitting: Emergency Medicine

## 2021-06-20 ENCOUNTER — Ambulatory Visit
Admission: EM | Admit: 2021-06-20 | Discharge: 2021-06-20 | Disposition: A | Discharge status: Home / Self Care | Payer: PRIVATE HEALTH INSURANCE | Attending: Emergency Medicine | Admitting: Emergency Medicine

## 2021-06-20 DIAGNOSIS — Z113 Encounter for screening for infections with a predominantly sexual mode of transmission: Secondary | ICD-10-CM | POA: Diagnosis not present

## 2021-06-20 NOTE — ED Provider Notes (Signed)
HPI ? ?SUBJECTIVE: ? ?Brandon Charles is a 51 y.o. male who presents for STD screening.  States he had intercourse with a new male partner 1 and half weeks ago.  His new partner had not had any sexual contact for 6 years prior to being intimate with him.  He states that she told him that she is having symptoms, but did not clarify what symptoms she is having.  She told him that he needed to get checked for STDs.  He has not had intercourse with his previous male partner for 2 months.  He does not have any other sexual partners currently.  He is asymptomatic today.  No nausea, vomiting, fevers, abdominal, back, pelvic pain, urinary complaints, testicular rash, penile discharge, swelling, pain, testicular, scrotal swelling or pain.  He has never had an STD.  He has a past medical history of syncope.  PCP: None. ? ? ? ?Past Medical History:  ?Diagnosis Date  ? Murmur   ? ? ?Past Surgical History:  ?Procedure Laterality Date  ? WRIST SURGERY Right   ? ? ?Family History  ?Problem Relation Age of Onset  ? Breast cancer Mother   ? ? ?Social History  ? ?Tobacco Use  ? Smoking status: Every Day  ?  Packs/day: 0.50  ?  Types: Cigarettes  ? Smokeless tobacco: Never  ? Tobacco comments:  ?  1/2 pack every 2 days  ?Vaping Use  ? Vaping Use: Never used  ?Substance Use Topics  ? Alcohol use: Yes  ? Drug use: No  ? ? ?No current facility-administered medications for this encounter. ? ?Current Outpatient Medications:  ?  fludrocortisone (FLORINEF) 0.1 MG tablet, Take 1 tablet (0.1 mg total) by mouth daily., Disp: 90 tablet, Rfl: 4 ?  midodrine (PROAMATINE) 10 MG tablet, Take 1 tablet (10 mg total) by mouth 3 (three) times daily as needed., Disp: 90 tablet, Rfl: 6 ? ?No Known Allergies ? ? ?ROS ? ?As noted in HPI.  ? ?Physical Exam ? ?BP 121/70 (BP Location: Left Arm)   Pulse 83   Temp 98.2 ?F (36.8 ?C) (Oral)   Resp 18   SpO2 98%  ? ?Constitutional: Well developed, well nourished, no acute distress ?Eyes:  EOMI, conjunctiva  normal bilaterally ?HENT: Normocephalic, atraumatic,mucus membranes moist ?Respiratory: Normal inspiratory effort ?Cardiovascular: Normal rate ?GI: nondistended ?GU: Normal uncircumcised male, testes descended bilaterally.  No testicular, epididymal tenderness or swelling.  No penile rash, discharge.  Glans normal.  No inguinal lymphadenopathy.  Patient declined chaperone. ?skin: No rash, skin intact ?Musculoskeletal: no deformities ?Neurologic: Alert & oriented x 3, no focal neuro deficits ?Psychiatric: Speech and behavior appropriate ? ? ?ED Course ? ? ?Medications - No data to display ? ?Orders Placed This Encounter  ?Procedures  ? RPR  ?  Standing Status:   Standing  ?  Number of Occurrences:   1  ? HIV Antibody (routine testing w rflx)  ?  Standing Status:   Standing  ?  Number of Occurrences:   1  ? ? ?No results found for this or any previous visit (from the past 24 hour(s)). ?No results found. ? ?ED Clinical Impression ? ?1. Screening for STDs (sexually transmitted diseases)   ?  ? ?ED Assessment/Plan ? ?I obtained the penile swab  for STD testing. Checking for gonorrhea, chlamydia, trichomonas, HIV, RPR.  Will provide primary care list and order assistance in finding a PCP.  Advised patient to refrain from intercourse until all labs are resulted and  he and his partner are treated if necessary. ? ?Discussed labs,  MDM, treatment plan, and plan for follow-up with patient.  patient agrees with plan.  ? ?No orders of the defined types were placed in this encounter. ? ? ? ? ?*This clinic note was created using Scientist, clinical (histocompatibility and immunogenetics). Therefore, there may be occasional mistakes despite careful proofreading. ? ?? ? ?  ?Domenick Gong, MD ?06/20/21 1910 ? ?

## 2021-06-20 NOTE — Discharge Instructions (Signed)
Give [place] a working phone number so that we can contact you if needed. Refrain from sexual contact until all of your labs have come back, and you and your partner(s) are treated if necessary.  ? ?Here is a list of primary care providers who are taking new patients: ? ?[facility] [place] ?Dr. Joseph Berkshire (sports medicine) ?Dr. Elizabeth Sauer ?[address] ?[address] ?[place] [address] ?[phone] ? ?[facility] ?[address], [place] [address] ?[phone] ? ?[facility] [place] ?[address] [place] [address]  ?[place] [address]  ?[phone] ? ?[facility] ?[address]  ?[place], [place] [address] ?[phone] ? ?[facility] ?[address]  ?[phone] ?[place], [place] [address] ? ?Here are clinics/ other resources who will see you if you do not have insurance. Some have certain criteria that you must meet. Call them and find out what they are: ? ?[facility]: ?[address], [place], [place] [address] ?Phone: [phone] ?Hours: First and Third Saturdays of each Month, 9 a.m. - 1 p.m. ? ?[facility]: ?[address], [place], [place] [address] ?Phone: [phone] ?Hours: ?Tuesday, 4 p.m. - 8 p.m. ?Thursday, 1 p.m. - 8 p.m. ?Wednesday, 9 a.m. - Noon ? ?[facility] ?[address] [place], [place] [address] ?Phone: [phone] ?Pharmacy Phone Number: [phone] ?Dental Phone Number: [phone] ?ACA Insurance Help: [id number] ? ?Dental Hours: ?Monday - Thursday, 8 a.m. - 6 p.m. ? ?[facility] ?[address] [place], [place] [address] ?Phone: [phone] ?Pharmacy Phone Number: [phone] ?ACA Insurance Help: [phone] ? ?[facility] ?[address], [place], [place] [address] ?Phone: [phone] ?Pharmacy Phone Number: [phone] ?ACA Insurance Help: [phone] ? ?[facility] ?[address] [place], [place] [address] ?Phone: [phone] ?ACA  Insurance Help: [phone]  ? ?[facility] ?[address], [place], [place] [address] ?Phone: [phone] ? ?Go to www.goodrx.com  or www.costplusdrugs.com to look up your medications. This will give you a list of where you can find your prescriptions at the most affordable prices. Or ask the pharmacist what the cash price is, or if they have any other discount programs available to help make your medication more affordable. This can be less expensive than what you would pay with insurance.   ?

## 2021-06-20 NOTE — ED Triage Notes (Signed)
Pt here for routine STI check. No known exposure, no sxs.  ?

## 2021-06-22 LAB — CYTOLOGY, (ORAL, ANAL, URETHRAL) ANCILLARY ONLY
Chlamydia: POSITIVE — AB
Comment: NEGATIVE
Comment: NEGATIVE
Comment: NORMAL
Neisseria Gonorrhea: NEGATIVE
Trichomonas: NEGATIVE

## 2021-06-22 LAB — HIV ANTIBODY (ROUTINE TESTING W REFLEX): HIV Screen 4th Generation wRfx: NONREACTIVE

## 2021-06-22 LAB — RPR: RPR Ser Ql: NONREACTIVE

## 2021-06-25 ENCOUNTER — Telehealth (HOSPITAL_COMMUNITY): Payer: Self-pay | Admitting: Emergency Medicine

## 2021-06-25 MED ORDER — DOXYCYCLINE HYCLATE 100 MG PO CAPS: 100.0000 mg | ORAL_CAPSULE | Freq: Two times a day (BID) | ORAL | 0 refills | Status: DC

## 2021-06-25 MED ORDER — DOXYCYCLINE HYCLATE 100 MG PO CAPS: 100.0000 mg | ORAL_CAPSULE | Freq: Two times a day (BID) | ORAL | 0 refills | Status: AC

## 2021-06-25 NOTE — Telephone Encounter (Signed)
Patient wanted prescription sent to a different pharmacy 

## 2021-07-18 ENCOUNTER — Encounter: Payer: Self-pay | Admitting: Emergency Medicine

## 2021-07-18 ENCOUNTER — Ambulatory Visit
Admission: EM | Admit: 2021-07-18 | Discharge: 2021-07-18 | Disposition: A | Discharge status: Home / Self Care | Payer: PRIVATE HEALTH INSURANCE | Attending: Family Medicine | Admitting: Family Medicine

## 2021-07-18 DIAGNOSIS — Z113 Encounter for screening for infections with a predominantly sexual mode of transmission: Secondary | ICD-10-CM | POA: Diagnosis not present

## 2021-07-18 NOTE — ED Provider Notes (Signed)
?[facility] ? ? ? ?CSN: [id number] ?Arrival date & time: 07/18/21  1648 ? ? ?  ? ?History   ?Chief Complaint ?No chief complaint on file. ? ? ?HPI ?Brandon Charles is a 51 y.o. male.  ? ?HPI ?Patient presents today for repeat STD testing.  He reports he has had a recent exposure.  Initially he stated that his girlfriend that transmit chlamydia to him recently retested and was clear.  However now he states that he has had a new exposure.  He is completely asymptomatic.  He had HIV and RPR testing less than 30 days ago which was negative. ?Past Medical History:  ?Diagnosis Date  ? Murmur   ? ? ?Patient Active Problem List  ? Diagnosis Date Noted  ? Orthostatic hypotension 08/29/2016  ? Chest pain 08/26/2016  ? Syncope 08/26/2016  ? ? ?Past Surgical History:  ?Procedure Laterality Date  ? WRIST SURGERY Right   ? ? ? ? ? ?Home Medications   ? ?Prior to Admission medications   ?Medication Sig Start Date End Date Taking? Authorizing Provider  ?fludrocortisone (FLORINEF) 0.1 MG tablet Take 1 tablet (0.1 mg total) by mouth daily. 05/04/19   Antonieta Iba, MD  ?midodrine (PROAMATINE) 10 MG tablet Take 1 tablet (10 mg total) by mouth 3 (three) times daily as needed. 05/04/19   Antonieta Iba, MD  ? ? ?Family History ?Family History  ?Problem Relation Age of Onset  ? Breast cancer Mother   ? ? ?Social History ?Social History  ? ?Tobacco Use  ? Smoking status: Every Day  ?  Packs/day: 0.50  ?  Types: Cigarettes  ? Smokeless tobacco: Never  ? Tobacco comments:  ?  1/2 pack every 2 days  ?Vaping Use  ? Vaping Use: Never used  ?Substance Use Topics  ? Alcohol use: Yes  ? Drug use: No  ? ? ? ?Allergies   ?Patient has no known allergies. ? ? ?Review of Systems ?Review of Systems ?Pertinent negatives listed in HPI  ? ?Physical Exam ?Triage Vital Signs ?ED Triage Vitals  ?Enc Vitals Group  ?   BP 07/18/21 1703 132/86  ?   Pulse Rate 07/18/21 1703 86  ?   Resp 07/18/21 1703 20  ?   Temp 07/18/21 1703 98 ?F (36.7 ?C)  ?    Temp src --   ?   SpO2 07/18/21 1703 98 %  ?   Weight --   ?   Height --   ?   Head Circumference --   ?   Peak Flow --   ?   Pain Score 07/18/21 1705 0  ?   Pain Loc --   ?   Pain Edu? --   ?   Excl. in GC? --   ? ?No data found. ? ?Updated Vital Signs ?BP 132/86   Pulse 86   Temp 98 ?F (36.7 ?C)   Resp 20   SpO2 98%  ? ?Visual Acuity ?Right Eye Distance:   ?Left Eye Distance:   ?Bilateral Distance:   ? ?Right Eye Near:   ?Left Eye Near:    ?Bilateral Near:    ? ?Physical Exam ?Constitutional:   ?   Appearance: Normal appearance.  ?HENT:  ?   Head: Normocephalic.  ?Cardiovascular:  ?   Rate and Rhythm: Normal rate.  ?Pulmonary:  ?   Effort: Pulmonary effort is normal.  ?Skin: ?   General: Skin is warm.  ?Neurological:  ?  General: No focal deficit present.  ?   Mental Status: He is alert and oriented to person, place, and time.  ?Psychiatric:     ?   Mood and Affect: Mood normal.     ?   Behavior: Behavior normal.  ? ?Urethral Self-Swabbed. ?UC Treatments / Results  ?Labs ?(all labs ordered are listed, but only abnormal results are displayed) ?Labs Reviewed  ?CYTOLOGY, (ORAL, ANAL, URETHRAL) ANCILLARY ONLY  ? ? ?EKG ? ? ?Radiology ?No results found. ? ?Procedures ?Procedures (including critical care time) ? ?Medications Ordered in UC ?Medications - No data to display ? ?Initial Impression / Assessment and Plan / UC Course  ?I have reviewed the triage vital signs and the nursing notes. ? ?Pertinent labs & imaging results that were available during my care of the patient were reviewed by me and considered in my medical decision making (see chart for details). ? ?  ?Asymptomatic STD testing. Cytology pending. ?Patient advised he will be notified of any abnormal test results. ?Final Clinical Impressions(s) / UC Diagnoses  ? ?Final diagnoses:  ?Screen for STD (sexually transmitted disease)  ? ? ? ?Discharge Instructions   ? ?  ?Your cytology results will be available within 3 days. ?Our office will notify you if  any of your results are abnormal. ? ? ? ?ED Prescriptions   ?None ?  ? ?PDMP not reviewed this encounter. ?  ?Bing Neighbors, FNP ?07/18/21 1801 ? ?

## 2021-07-18 NOTE — Discharge Instructions (Addendum)
Your cytology results will be available within 3 days. ?Our office will notify you if any of your results are abnormal. ?

## 2021-07-18 NOTE — ED Triage Notes (Signed)
Pt here for an STI check. Was treated for Chlamydia less than a month ago. No sxs, no new exposure.  ?

## 2021-07-19 LAB — CYTOLOGY, (ORAL, ANAL, URETHRAL) ANCILLARY ONLY
Chlamydia: NEGATIVE
Comment: NEGATIVE
Comment: NEGATIVE
Comment: NORMAL
Neisseria Gonorrhea: NEGATIVE
Trichomonas: NEGATIVE

## 2021-09-04 NOTE — Progress Notes (Signed)
Office Visit    Patient Name: Brandon Charles Date of Encounter: 09/07/2021  Primary Care Provider:  Antonieta Iba, MD Primary Cardiologist:  None Primary Electrophysiologist: None  Chief Complaint    Brandon Charles is a 51 y.o. male with PMH of chronic orthostatic hypotension, microcytic anemia, mitral valve prolapse, tobacco abuse and chest pain of unknown etiology who presents today for annual follow-up of hypertension.  Past Medical History    Past Medical History:  Diagnosis Date   Murmur    Past Surgical History:  Procedure Laterality Date   WRIST SURGERY Right     Allergies  No Known Allergies  History of Present Illness    Brandon Charles is a 51 year old male with the above-mentioned past medical history who presents today for follow-up of orthostatic hypotension.  He was initially seen by Dr. Mariah Milling in 2018 after a single episode of syncope.  He was evaluated in the ED and EKG revealed LVH and precordial T waves with early repolarization and no ischemic changes.  2D echo was completed in 2021 with EF of 55-60% and mild to moderate MV regurgitation.  He was started on Florinef and midodrine as needed.  He was also advised to hydrate and add extra salt in his diet.  He was last seen by Dr. Mariah Milling on 04/2019.  Since last being seen in the office patient reports that he has been doing well but has had one episode of syncope last week. He states that this occurred while having intercourse in the shower. He denies any prodromal symptoms but states that he has been noncompliant with his medications over the past two years.  He denies any illicit drug use or ETOH consumption prior to his most recent syncopal episode. Patient denies chest pain, palpitations, dyspnea, PND, orthopnea, nausea, vomiting, dizziness, syncope, edema, weight gain, or early satiety.  Home Medications    No current outpatient medications on file.   No current facility-administered medications  for this visit.     Review of Systems  Please see the history of present illness.    (+) syncope  All other systems reviewed and are otherwise negative except as noted above.  Physical Exam    Wt Readings from Last 3 Encounters:  09/07/21 147 lb 8 oz (66.9 kg)  05/04/19 145 lb 8 oz (66 kg)  12/04/16 138 lb 8 oz (62.8 kg)   VS: Vitals:   09/07/21 1333  BP: 110/78  Pulse: 77  SpO2: 96%  ,Body mass index is 20 kg/m.  Orthostatic VS for the past 24 hrs (Last 3 readings):  BP- Lying Pulse- Lying BP- Sitting Pulse- Sitting BP- Standing at 0 minutes Pulse- Standing at 0 minutes BP- Standing at 3 minutes Pulse- Standing at 3 minutes  09/07/21 1428 110/67 75 113/66 79 112/63 90 111/70 91     Constitutional:      Appearance: Healthy appearance. Not in distress.  Neck:     Vascular: JVD normal.  Pulmonary:     Effort: Pulmonary effort is normal.     Breath sounds: No wheezing. No rales. Diminished in the bases Cardiovascular:     Normal rate. Regular rhythm. Normal S1. Normal S2.      Murmurs: There is no murmur.  Edema:    Peripheral edema absent.  Abdominal:     Palpations: Abdomen is soft non tender. There is no hepatomegaly.  Skin:    General: Skin is warm and dry.  Neurological:  General: No focal deficit present.     Mental Status: Alert and oriented to person, place and time.     Cranial Nerves: Cranial nerves are intact.  EKG/LABS/Other Studies Reviewed    ECG personally reviewed by me today -normal sinus rhythm with LVH and no acute changes  Lab Results  Component Value Date   CREATININE 1.16 05/04/2019   BUN 14 05/04/2019   NA 140 05/04/2019   K 4.1 05/04/2019   CL 102 05/04/2019   CO2 23 05/04/2019   No results found for: "ALT", "AST", "GGT", "ALKPHOS", "BILITOT" No results found for: "CHOL", "HDL", "LDLCALC", "LDLDIRECT", "TRIG", "CHOLHDL"  No results found for: "HGBA1C"  Assessment & Plan    1.  Orthostatic hypotension: -Orthostatic blood  pressures were completed in the office and revealed no changes and blood pressure but elevation in heart rate -Systolic murmur noted on auscultation as previously repeat 2D echo last completed 2021  2.  Syncope: -Patient states that he had one isolated episode of syncope that occurred 1 week ago while having intercourse. -He has been noncompliant with his Florinef and midodrine for the last 2 years -Resume and continue florinef 0.1 mg daily -Resume and continue midodrine 10 mg 3 times daily as needed   3.  Chest pain of unknown etiology: -Patient denies any recurrence of chest pain over the past 2 years  Disposition: Follow-up with None or APP in 1 months   Medication Adjustments/Labs and Tests Ordered: Current medicines are reviewed at length with the patient today.  Concerns regarding medicines are outlined above.   Signed, Napoleon Form, Leodis Rains, NP 09/07/2021, 2:49 PM [facility]

## 2021-09-07 ENCOUNTER — Encounter: Payer: Self-pay | Admitting: Nurse Practitioner

## 2021-09-07 ENCOUNTER — Ambulatory Visit (INDEPENDENT_AMBULATORY_CARE_PROVIDER_SITE_OTHER): Payer: PRIVATE HEALTH INSURANCE | Admitting: Nurse Practitioner

## 2021-09-07 VITALS — BP 110/78 | HR 77 | Ht 72.0 in | Wt 147.5 lb

## 2021-09-07 DIAGNOSIS — I341 Nonrheumatic mitral (valve) prolapse: Secondary | ICD-10-CM

## 2021-09-07 DIAGNOSIS — R079 Chest pain, unspecified: Secondary | ICD-10-CM | POA: Diagnosis not present

## 2021-09-07 DIAGNOSIS — R55 Syncope and collapse: Secondary | ICD-10-CM | POA: Diagnosis not present

## 2021-09-07 MED ORDER — MIDODRINE HCL 10 MG PO TABS: 10.0000 mg | ORAL_TABLET | Freq: Three times a day (TID) | ORAL | 6 refills | Status: DC | PRN

## 2021-09-07 MED ORDER — FLUDROCORTISONE ACETATE 0.1 MG PO TABS: 0.1000 mg | ORAL_TABLET | Freq: Every day | ORAL | 3 refills | Status: DC

## 2021-10-12 ENCOUNTER — Ambulatory Visit (INDEPENDENT_AMBULATORY_CARE_PROVIDER_SITE_OTHER): Payer: 59

## 2021-10-12 DIAGNOSIS — I341 Nonrheumatic mitral (valve) prolapse: Secondary | ICD-10-CM | POA: Diagnosis not present

## 2021-10-12 DIAGNOSIS — R079 Chest pain, unspecified: Secondary | ICD-10-CM

## 2021-10-12 LAB — ECHOCARDIOGRAM COMPLETE
AR max vel: 2.54 cm2
AV Area VTI: 2.26 cm2
AV Area mean vel: 2.16 cm2
AV Mean grad: 2 mmHg
AV Peak grad: 4.3 mmHg
Ao pk vel: 1.04 m/s
Area-P 1/2: 2.18 cm2
Calc EF: 43.8 %
S' Lateral: 3.9 cm
Single Plane A2C EF: 43.6 %
Single Plane A4C EF: 44.8 %

## 2021-10-31 ENCOUNTER — Ambulatory Visit (INDEPENDENT_AMBULATORY_CARE_PROVIDER_SITE_OTHER): Payer: 59 | Admitting: Cardiovascular Disease

## 2021-10-31 ENCOUNTER — Other Ambulatory Visit
Admission: RE | Admit: 2021-10-31 | Discharge: 2021-10-31 | Disposition: A | Discharge status: Home / Self Care | Payer: PRIVATE HEALTH INSURANCE | Attending: Cardiovascular Disease | Admitting: Cardiovascular Disease

## 2021-10-31 ENCOUNTER — Encounter: Payer: Self-pay | Admitting: Cardiovascular Disease

## 2021-10-31 VITALS — BP 128/78 | HR 69 | Ht 72.0 in | Wt 148.2 lb

## 2021-10-31 DIAGNOSIS — I341 Nonrheumatic mitral (valve) prolapse: Secondary | ICD-10-CM

## 2021-10-31 DIAGNOSIS — R55 Syncope and collapse: Secondary | ICD-10-CM

## 2021-10-31 DIAGNOSIS — R079 Chest pain, unspecified: Secondary | ICD-10-CM

## 2021-10-31 DIAGNOSIS — I951 Orthostatic hypotension: Secondary | ICD-10-CM | POA: Diagnosis not present

## 2021-10-31 LAB — BASIC METABOLIC PANEL
Anion gap: 5 (ref 5–15)
BUN: 17 mg/dL (ref 6–20)
CO2: 31 mmol/L (ref 22–32)
Calcium: 9.3 mg/dL (ref 8.9–10.3)
Chloride: 104 mmol/L (ref 98–111)
Creatinine, Ser: 1.03 mg/dL (ref 0.61–1.24)
GFR, Estimated: >60 mL/min (ref >60)
Glucose, Bld: 97 mg/dL (ref 70–99)
Potassium: 4.4 mmol/L (ref 3.5–5.1)
Sodium: 140 mmol/L (ref 135–145)

## 2021-10-31 NOTE — Progress Notes (Signed)
Date:  10/31/2021   ID:  [name], DOB [birth year], MRN [id number]  Patient Location:  [address] [place] [address]   Provider location:   [facility], [place] office  PCP:  Antonieta Iba, MD  Cardiologist:  [facility]  Chief Complaint  Patient presents with   6 week follow up and discuss Echo.     "Doing well." Medications reviewed by the patient verbally.     History of Present Illness:    Brandon Charles is a 51 y.o. male past medical history of history of near-syncope and syncope dating back 20 years previous workup including stress testing and echocardiography unrevealing Chronic orthostasis episode of syncope over the summer 2018  while getting up to go to the bathroom  Who presents or follow-up of his orthostasis and syncope  Last seen by myself in clinic February 2021 Working three jobs Works 7 days a week Recently started working for [employer] jobs, lots of sweating, tries to stay hydrated Weight stable, continues to run low, tries not to overeat  Reports his girlfriend has set him up to see primary care  Denies any near-syncope or syncope Reports he has been taking Florinef daily, also has been taking midodrine every morning but denies any orthostasis symptoms  No recent lab work available  EKG personally reviewed by myself on todays visit Shows normal sinus rhythm rate 69 bpm unable to exclude LVH versus normal variant, Seen on prior EKGs  Discussed EKG findings, Prior echocardiogram pulled up from 2005, no LVH at that time  No recent trips to the emergency room    Past Medical History:  Diagnosis Date   Murmur    Past Surgical History:  Procedure Laterality Date   WRIST SURGERY Right      Current Meds  Medication Sig   fludrocortisone (FLORINEF) 0.1 MG tablet Take 1 tablet (0.1 mg total) by mouth daily.   midodrine (PROAMATINE) 10 MG tablet Take 1 tablet (10 mg total) by mouth 3 (three) times  daily as needed.     Allergies:   Patient has no known allergies.   Social History   Tobacco Use   Smoking status: Former    Packs/day: 0.50    Types: Cigarettes    Quit date: 05/09/2019    Years since quitting: 2.4   Smokeless tobacco: Never  Vaping Use   Vaping Use: Never used  Substance Use Topics   Alcohol use: Yes   Drug use: No     Current Outpatient Medications on File Prior to Visit  Medication Sig Dispense Refill   fludrocortisone (FLORINEF) 0.1 MG tablet Take 1 tablet (0.1 mg total) by mouth daily. 90 tablet 3   midodrine (PROAMATINE) 10 MG tablet Take 1 tablet (10 mg total) by mouth 3 (three) times daily as needed. 270 tablet 6   No current facility-administered medications on file prior to visit.     Family Hx: The patient's family history includes Breast cancer in his mother.  ROS:   Please see the history of present illness.    Review of Systems  Constitutional: Negative.   HENT: Negative.    Respiratory: Negative.    Cardiovascular: Negative.   Gastrointestinal: Negative.   Musculoskeletal: Negative.   Neurological: Negative.   Psychiatric/Behavioral: Negative.    All other systems reviewed and are negative.    Labs/Other Tests and Data Reviewed:    Recent Labs: No results found for requested labs  within last 365 days.   Recent Lipid Panel No results found for: "CHOL", "TRIG", "HDL", "CHOLHDL", "LDLCALC", "LDLDIRECT"  Wt Readings from Last 3 Encounters:  10/31/21 148 lb 4 oz (67.2 kg)  09/07/21 147 lb 8 oz (66.9 kg)  05/04/19 145 lb 8 oz (66 kg)     Exam:    Vital Signs: Vital signs may also be detailed in the HPI BP 128/78 (BP Location: Left Arm, Patient Position: Sitting, Cuff Size: Normal)   Pulse 69   Ht 6' (1.829 m)   Wt 148 lb 4 oz (67.2 kg)   SpO2 98%   BMI 20.11 kg/m   Constitutional:  oriented to person, place, and time. No distress.  HENT:  Head: Grossly normal Eyes:  no discharge. No scleral icterus.  Neck: No JVD, no  carotid bruits  Cardiovascular: Regular rate and rhythm, no murmurs appreciated Pulmonary/Chest: Clear to auscultation bilaterally, no wheezes or rails Abdominal: Soft.  no distension.  no tenderness.  Musculoskeletal: Normal range of motion Neurological:  normal muscle tone. Coordination normal. No atrophy Skin: Skin warm and dry Psychiatric: normal affect, pleasant  ASSESSMENT & PLAN:    Syncope, unspecified syncope type -  No recent near-syncope or syncope, blood pressure stable BMP today Continue Florinef 0.1 mg daily Midodrine as needed Recommend try not to lose weight and try to gain weight if possible Heart rate would help with blood pressure   Chest pain, unspecified type - No recent chest pain, no further work-up needed   Orthostatic hypotension As above will continue Florinef Midodrine as needed, for unclear reasons has been taking midodrine every morning despite no symptoms of orthostasis Discussed mechanism of each pill  Chronic back pain Stable  Erectile dysfunction Reports having some issues, requesting blue pill Suggested he would need to be very cautious given the vasodilatory effects of Viagra/sildenafil He prefers to hold off on the medication at this time   Total encounter time more than 30 minutes  Greater than 50% was spent in counseling and coordination of care with the patient   Signed, Julien Nordmann, MD  10/31/2021 9:43 AM    [facility] [address], [place], [place] [address]

## 2021-10-31 NOTE — Patient Instructions (Addendum)
Medication Instructions:  No changes  Florinef: makes you hold on to salt and water Midodrine: squeezes the artery to raise the pressure , take as needed for dizziness  If you need a refill on your cardiac medications before your next appointment, please call your pharmacy.   Lab work: Today: BMP  Medical Mall Entrance at [facility] 1st desk on the right to check in (REGISTRATION)  Lab hours: Monday- Friday (7:30 am- 5:30 pm)   Testing/Procedures: No new testing needed  Follow-Up: At [facility], you and your health needs are our priority.  As part of our continuing mission to provide you with exceptional heart care, we have created designated Provider Care Teams.  These Care Teams include your primary Cardiologist (physician) and Advanced Practice Providers (APPs -  Physician Assistants and Nurse Practitioners) who all work together to provide you with the care you need, when you need it.  You will need a follow up appointment in 12 months  Providers on your designated Care Team:   Nicolasa Ducking, NP Eula Listen, PA-C Cadence Fransico Michael, [place]  COVID-19 Vaccine Information can be found at: [email or web address] For questions related to vaccine distribution or appointments, please email [email or web address] or call 3186604121.

## 2022-08-02 ENCOUNTER — Telehealth: Payer: Self-pay | Admitting: Cardiovascular Disease

## 2022-08-02 NOTE — Telephone Encounter (Signed)
Appointment scheduled for DOT physical

## 2022-08-02 NOTE — Telephone Encounter (Signed)
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 Pt says he needs this back as soon as possible and would like a call once they are ready for pickup. Please advise

## 2022-08-14 NOTE — Progress Notes (Unsigned)
Cardiology Office Note:   Date:  08/15/2022  ID:  JURRELL KINTNER, DOB Aug 01, 1970, MRN [id number]  History of Present Illness:   Brandon Charles is a 52 y.o. male with a past medical history of near syncope and syncope dating back approximately 20 years, chronic orthostasis, former smoker, who is here today for follow-up.  He had previous workup including stress testing and echocardiogram was unrevealing for syncope.  He has chronic orthostasis with an episode of syncope over the summer 2018.  He is continued on Florinef and midodrine.  He was last seen in clinic 10/31/2021 by Dr. Mariah Milling.  At that time he was doing well and recently started working for [employer].  There were no changes made to his medication and no further testing that was ordered.  He returns to clinic today stating that he has been doing well.  Denies any chest pain, shortness of breath, palpitations, or peripheral edema. It has been approximately 2 years any episodes of syncope.  He also has only been taking his medications on an as-needed basis for approximately the last 8 months.  He has also secured a new primary care provider has been keeping up with his routine labs.  He denies any hospitalizations or visits to the emergency department.  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  ROS: 10 point review of systems was completed and considered negative with exception of what is listed in the HPI  Studies Reviewed:    EKG: Sinus rhythm with a rate of 73, LVH, no acute changes from prior studies  TTE 10/12/21 1. Left ventricular ejection fraction, by estimation, is 45 to 50%. The  left ventricle has mildly decreased function. The left ventricle has no  regional wall motion abnormalities. Left ventricular diastolic parameters  were normal. The average left  ventricular  global longitudinal strain is -14.7 %.   2. Right ventricular systolic function is normal. The right ventricular  size is normal. Tricuspid regurgitation signal is inadequate for assessing  PA pressure.   3. The mitral valve is normal in structure. Mild mitral valve  regurgitation. No evidence of mitral stenosis.   4. The aortic valve is normal in structure. Aortic valve regurgitation is  not visualized. No aortic stenosis is present.   5. The inferior vena cava is normal in size with greater than 50%  respiratory variability, suggesting right atrial pressure of 3 mmHg.   Risk Assessment/Calculations:              Physical Exam:   VS:  BP 112/72 (BP Location: Left Arm, Patient Position: Sitting, Cuff Size: Normal)   Pulse 73   Ht 6' (1.829 m)   Wt 148 lb 9.6 oz (67.4 kg)   SpO2 98%   BMI 20.15 kg/m    Wt Readings from Last 3 Encounters:  08/15/22 148 lb 9.6 oz (67.4 kg)  10/31/21 148 lb 4 oz (67.2 kg)  09/07/21 147 lb 8 oz (66.9 kg)     GEN: Well nourished, well developed in no acute distress NECK: No JVD; No carotid bruits CARDIAC: RRR, no murmurs, rubs, gallops RESPIRATORY:  Clear to auscultation without rales, wheezing or rhonchi  ABDOMEN: Soft, non-tender, non-distended EXTREMITIES:  No edema; No deformity   ASSESSMENT AND PLAN:   History of near syncope and syncope.  No evidence  for the last 2 years.  He is taking Florinef and midodrine as needed.  Recently had blood work done by his PCP that was stable.  Blood pressure and heart rate have been stable today.  He denies any dizziness, lightheadedness, or near syncopal episodes.  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  He has been scheduled for an echocardiogram to follow-up on previous mention of MVP that has not been found on previous echocardiograms.  History of orthostatic hypotension with blood pressure today 112/72.  He has been taking medications on an as-needed basis for  approximately the last 8 months.  He is very well versed on the symptoms of orthostasis.  Recommended to continue with his hydration and maintain his weight to prevent reoccurrence.  Chronic back pain that has remained stable.  Erectile dysfunction previously had been requesting Viagra/sildenafil.  Previously as his PCP about these medications of well and was referred back to cardiology.  Unfortunately with his history of syncope/near syncope and orthostatic hypotension he would need to be very cautious given that these dilation effects of these medications.  Would recommend he hold off on medications.  Disposition is to clinic to see MD/APP in 2 months with an EKG on return sooner if needed.  He was given a note for missing work today and a letter stating that he has been deemed at appropriate risk for the change in job description as he has not suffered from any further episodes of syncope/ near snycopein at least 2 years.        Signed, Filimon Miranda, NP

## 2022-08-15 ENCOUNTER — Encounter: Payer: Self-pay | Admitting: Cardiology

## 2022-08-15 ENCOUNTER — Ambulatory Visit: Payer: 59 | Attending: Cardiology | Admitting: Cardiology

## 2022-08-15 VITALS — BP 112/72 | HR 73 | Ht 72.0 in | Wt 148.6 lb

## 2022-08-15 DIAGNOSIS — I341 Nonrheumatic mitral (valve) prolapse: Secondary | ICD-10-CM

## 2022-08-15 DIAGNOSIS — R55 Syncope and collapse: Secondary | ICD-10-CM

## 2022-08-15 DIAGNOSIS — G8929 Other chronic pain: Secondary | ICD-10-CM

## 2022-08-15 DIAGNOSIS — M545 Low back pain, unspecified: Secondary | ICD-10-CM | POA: Diagnosis not present

## 2022-08-15 DIAGNOSIS — I951 Orthostatic hypotension: Secondary | ICD-10-CM | POA: Diagnosis not present

## 2022-08-15 DIAGNOSIS — N529 Male erectile dysfunction, unspecified: Secondary | ICD-10-CM

## 2022-08-15 MED ORDER — FLUDROCORTISONE ACETATE 0.1 MG PO TABS: 0.1000 mg | ORAL_TABLET | ORAL | 3 refills | Status: AC | PRN

## 2022-08-15 MED ORDER — MIDODRINE HCL 10 MG PO TABS: 10.0000 mg | ORAL_TABLET | Freq: Three times a day (TID) | ORAL | 6 refills | Status: AC | PRN

## 2022-08-15 NOTE — Patient Instructions (Signed)
Medication Instructions:  Your physician recommends that you continue on your current medications as directed. Please refer to the Current Medication list given to you today.  *If you need a refill on your cardiac medications before your next appointment, please call your pharmacy*  Lab Work: -None ordered If you have labs (blood work) drawn today and your tests are completely normal, you will receive your results only by: MyChart Message (if you have MyChart) OR A paper copy in the mail If you have any lab test that is abnormal or we need to change your treatment, we will call you to review the results.  Testing/Procedures: -None ordered  Follow-Up: At [facility], you and your health needs are our priority.  As part of our continuing mission to provide you with exceptional heart care, we have created designated Provider Care Teams.  These Care Teams include your primary Cardiologist (physician) and Advanced Practice Providers (APPs -  Physician Assistants and Nurse Practitioners) who all work together to provide you with the care you need, when you need it.   Your next appointment:   1 year(s)  Provider:   You may see Julien Nordmann, MD or one of the following Advanced Practice Providers on your designated Care Team:   Nicolasa Ducking, NP Eula Listen, PA-C Cadence Fransico Michael, PA-C Charlsie Quest, NP    Other Instructions -None

## 2022-10-04 ENCOUNTER — Ambulatory Visit: Payer: BC Managed Care – PPO | Attending: Cardiology

## 2022-10-04 DIAGNOSIS — I341 Nonrheumatic mitral (valve) prolapse: Secondary | ICD-10-CM | POA: Diagnosis not present

## 2022-10-04 LAB — ECHOCARDIOGRAM COMPLETE
AR max vel: 2.94 cm2
AV Area VTI: 2.85 cm2
AV Area mean vel: 2.81 cm2
AV Mean grad: 2 mmHg
AV Peak grad: 4.2 mmHg
Ao pk vel: 1.03 m/s
Area-P 1/2: 2.95 cm2
Calc EF: 48.7 %
S' Lateral: 3.8 cm
Single Plane A2C EF: 48.2 %
Single Plane A4C EF: 47 %

## 2022-10-10 NOTE — Progress Notes (Signed)
Heart squeeze remains 45-50%, unchanged from prior study, continues to have mild leakage from one of the heart valves that is unchanged. Continue current medication regimen.

## 2023-08-26 ENCOUNTER — Ambulatory Visit: Attending: Cardiology | Admitting: Cardiology

## 2023-08-26 ENCOUNTER — Encounter: Payer: Self-pay | Admitting: Cardiology

## 2023-08-26 VITALS — BP 110/74 | HR 68 | Ht 72.0 in | Wt 152.0 lb

## 2023-08-26 DIAGNOSIS — G8929 Other chronic pain: Secondary | ICD-10-CM | POA: Diagnosis not present

## 2023-08-26 DIAGNOSIS — R55 Syncope and collapse: Secondary | ICD-10-CM | POA: Diagnosis not present

## 2023-08-26 DIAGNOSIS — M545 Low back pain, unspecified: Secondary | ICD-10-CM | POA: Diagnosis not present

## 2023-08-26 DIAGNOSIS — N529 Male erectile dysfunction, unspecified: Secondary | ICD-10-CM

## 2023-08-26 DIAGNOSIS — I951 Orthostatic hypotension: Secondary | ICD-10-CM

## 2023-08-26 NOTE — Progress Notes (Signed)
 Cardiology Office Note   Date:  08/26/2023  ID:  Brandon Charles, DOB May 13, 1970, MRN [id number] PCP: Oneita Bihari, MD  [facility] Providers Cardiologist:  Belva Boyden, MD     History of Present Illness Brandon Charles is a 53 y.o. male with a past medical history of near syncope and syncope dating back approximately 20 years, chronic orthostasis, former smoker, who is here today for follow-up.   He had previous workup including stress testing and echocardiogram was unrevealing for syncope.  He has chronic orthostasis with an episode of syncope over the summer 2018.  He is continued on Florinef  and midodrine .   He was seen in clinic 10/31/2021 by Dr. Gollan.  At that time he was doing well and recently started working for [employer].  There were no changes made to his medication and no further testing that was ordered.   He was last seen in clinic 08/15/2022 states that overall he been doing well.  Denied any anginal or anginal equivalent symptoms.  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  He was scheduled for an echocardiogram due to a mention of previous MVP.  There were no medication changes that were made.  He returns to clinic today stating from a cardiac perspective he has been doing well.  He is continue to take his midodrine  and Florinef  on a as needed basis.  He has not required the use of them on a consistent basis due to blood pressure staying regulated with his continued efforts to maintain weight and adequate hydration.  He denies any hospitalizations or visits to the emergency department.  States that he has not had any recent blood work done.  ROS: 10 point review of systems has been reviewed and considered negative the exception ones were listed in the HPI  Studies Reviewed EKG  Interpretation Date/Time:  Tuesday August 26 2023 10:49:05 EDT Ventricular Rate:  68 PR Interval:  136 QRS Duration:  98 QT Interval:  394 QTC Calculation: 418 R Axis:   68  Text Interpretation: Normal sinus rhythm Minimal voltage criteria for LVH, may be normal variant ( Sokolow-Lyon ) When compared with ECG of 04-May-2019 08:45, No significant change was found Confirmed by Ronald Cockayne ([id number]) on 08/26/2023 10:53:35 AM   2D echo 10/04/2022  1. Left ventricular ejection fraction, by estimation, is 45 to 50%. Left  ventricular ejection fraction by 3D volume is 52 %. Left ventricular  ejection fraction by 2D MOD biplane is 48.7 %. The left ventricle has  mildly decreased function. The left  ventricle demonstrates global hypokinesis. Left ventricular diastolic  parameters were normal. The average left ventricular global longitudinal  strain is -16.5 %. The global longitudinal strain is normal.   2. Right ventricular systolic function is low normal. The right  ventricular size is normal.   3. The mitral valve is normal in structure. Mild mitral valve  regurgitation.   4. The aortic valve is tricuspid. Aortic valve regurgitation is not  visualized.   5. The inferior vena cava is dilated in size with <50% respiratory  variability, suggesting right atrial pressure of 15 mmHg.   TTE 10/12/21 1. Left ventricular ejection fraction, by estimation, is 45 to 50%. The  left ventricle has mildly decreased function. The left ventricle has no  regional wall motion abnormalities. Left  ventricular diastolic parameters  were normal. The average left  ventricular global longitudinal strain is -14.7 %.   2. Right ventricular systolic function is normal. The right ventricular  size is normal. Tricuspid regurgitation signal is inadequate for assessing  PA pressure.   3. The mitral valve is normal in structure. Mild mitral valve  regurgitation. No evidence of mitral stenosis.   4. The aortic valve is  normal in structure. Aortic valve regurgitation is  not visualized. No aortic stenosis is present.   5. The inferior vena cava is normal in size with greater than 50%  respiratory variability, suggesting right atrial pressure of 3 mmHg.  Risk Assessment/Calculations           Physical Exam VS:  BP 110/74   Pulse 68   Ht 6' (1.829 m)   Wt 152 lb (68.9 kg)   SpO2 97%   BMI 20.61 kg/m    Wt Readings from Last 3 Encounters:  08/26/23 152 lb (68.9 kg)  08/15/22 148 lb 9.6 oz (67.4 kg)  10/31/21 148 lb 4 oz (67.2 kg)    GEN: Well nourished, well developed in no acute distress NECK: No JVD; No carotid bruits CARDIAC: RRR, no murmurs, rubs, gallops RESPIRATORY:  Clear to auscultation without rales, wheezing or rhonchi  ABDOMEN: Soft, non-tender, non-distended EXTREMITIES:  No edema; No deformity   ASSESSMENT AND PLAN History of syncope and near syncope no evidence of repeated events of the last 3 years.  He has continued to have midodrine  and Florinef  on an as needed basis which he has not needed recently.  He is being sent for updated labs today of CBC and a BMP.  He has also been encouraged to continue to monitor his pressures at home as well.  Echocardiogram completed in 09/2022 revealed a stable LVEF of 45 to 50%, mildly decreased function, without MVP noted there was mild mitral regurgitation.  No other changes from prior studies.  No repeat study was ordered today as he is asymptomatic.  Advised him with his mild mitral regurgitation and moderately reduced EF that we would need to continue with surveillance studies every 3 to 5 years.  History of orthostatic hypotension with a blood pressure today 110/74.  Blood pressure has been stable.  He continues with Florinef  and midodrine  on an as-needed basis.  He is very well versed on symptoms of orthostasis.  He has continued to maintain adequate hydration and maintain his weight to prevent reoccurrence.  Chronic back pain that has remained  stable.  Erectile dysfunction we stated he had previously talked with his PCP about currently medications and was referred back to cardiology.  Unfortunately with his history of syncope/near syncope and orthostatic hypotension he is not an ideal candidate and we would need to be extremely cautious given the dilatation effects of these medications.  Recommend he hold off on these medications as long as possible.  I did discuss that the ED is often directly correlated to cholesterol.  He has been sent for an updated cholesterol panel today.       Dispo: Patient to return to clinic to see MD/APP in 11 to 12 months or sooner if needed for further evaluation  Signed, Odelia Graciano, NP

## 2023-08-26 NOTE — Patient Instructions (Signed)
 Medication Instructions:   Your physician recommends that you continue on your current medications as directed. Please refer to the Current Medication list given to you today.   *If you need a refill on your cardiac medications before your next appointment, please call your pharmacy*  Lab Work:  Your provider would like for you to have following labs drawn today CBC, BMET, LIPID PANEL  If you have labs (blood work) drawn today and your tests are completely normal, you will receive your results only by: MyChart Message (if you have MyChart) OR A paper copy in the mail If you have any lab test that is abnormal or we need to change your treatment, we will call you to review the results.  Testing/Procedures:  No test ordered today   Follow-Up: At [facility], you and your health needs are our priority.  As part of our continuing mission to provide you with exceptional heart care, our providers are all part of one team.  This team includes your primary Cardiologist (physician) and Advanced Practice Providers or APPs (Physician Assistants and Nurse Practitioners) who all work together to provide you with the care you need, when you need it.  Your next appointment:   12 month(s)  Provider:   Timothy Gollan, MD or Ronald Cockayne, NP

## 2023-08-27 LAB — LIPID PANEL
Chol/HDL Ratio: 3 ratio (ref 0.0–5.0)
Cholesterol, Total: 199 mg/dL (ref 100–199)
HDL: 67 mg/dL (ref >39)
LDL Chol Calc (NIH): 115 mg/dL — ABNORMAL HIGH (ref 0–99)
Triglycerides: 94 mg/dL (ref 0–149)
VLDL Cholesterol Cal: 17 mg/dL (ref 5–40)

## 2023-08-27 LAB — CBC
Hematocrit: 37.4 % — ABNORMAL LOW (ref 37.5–51.0)
Hemoglobin: 12.2 g/dL — ABNORMAL LOW (ref 13.0–17.7)
MCH: 33.9 pg — ABNORMAL HIGH (ref 26.6–33.0)
MCHC: 32.6 g/dL (ref 31.5–35.7)
MCV: 104 fL — ABNORMAL HIGH (ref 79–97)
Platelets: 250 10*3/uL (ref 150–450)
RBC: 3.6 x10E6/uL — ABNORMAL LOW (ref 4.14–5.80)
RDW: 11.2 % — ABNORMAL LOW (ref 11.6–15.4)
WBC: 3.6 10*3/uL (ref 3.4–10.8)

## 2023-08-27 LAB — BASIC METABOLIC PANEL WITH GFR
BUN/Creatinine Ratio: 12 (ref 9–20)
BUN: 14 mg/dL (ref 6–24)
CO2: 22 mmol/L (ref 20–29)
Calcium: 9.6 mg/dL (ref 8.7–10.2)
Chloride: 105 mmol/L (ref 96–106)
Creatinine, Ser: 1.16 mg/dL (ref 0.76–1.27)
Glucose: 86 mg/dL (ref 70–99)
Potassium: 4.2 mmol/L (ref 3.5–5.2)
Sodium: 143 mmol/L (ref 134–144)
eGFR: 77 mL/min/{1.73_m2} (ref >59)

## 2023-08-28 ENCOUNTER — Ambulatory Visit: Payer: Self-pay | Admitting: Cardiology

## 2023-08-28 NOTE — Progress Notes (Signed)
 Blood counts have gradually dropped over the years.  Recommend follow-up with primary care provider for further evaluation.  Kidney function, potassium, sodium, glucose follow-up remained stable.  LDL or bad cholesterol is elevated at 115.  Recommend lifestyle and dietary modification with reducing prepackaged preprocessed, fast, fried, fatty foods.
